# Patient Record
Sex: Male | Born: 1970 | Race: White | Hispanic: No | Marital: Single | Smoking: Current every day smoker
Health system: Southern US, Community
[De-identification: ages and names within clinical notes are randomized; demographics above are authoritative.]

---

## 2003-08-25 ENCOUNTER — Inpatient Hospital Stay (HOSPITAL_COMMUNITY): Admission: EM | Admit: 2003-08-25 | Discharge: 2003-08-27 | Payer: Self-pay | Admitting: Emergency Medicine

## 2017-03-29 ENCOUNTER — Emergency Department (HOSPITAL_COMMUNITY): Payer: Self-pay

## 2017-03-29 ENCOUNTER — Emergency Department (HOSPITAL_COMMUNITY)
Admission: EM | Admit: 2017-03-29 | Discharge: 2017-03-29 | Disposition: A | Discharge status: Home / Self Care | Payer: Self-pay | Attending: Emergency Medicine | Admitting: Emergency Medicine

## 2017-03-29 ENCOUNTER — Encounter (HOSPITAL_COMMUNITY): Payer: Self-pay | Admitting: Emergency Medicine

## 2017-03-29 DIAGNOSIS — Y92008 Other place in unspecified non-institutional (private) residence as the place of occurrence of the external cause: Secondary | ICD-10-CM | POA: Insufficient documentation

## 2017-03-29 DIAGNOSIS — Y999 Unspecified external cause status: Secondary | ICD-10-CM | POA: Insufficient documentation

## 2017-03-29 DIAGNOSIS — X509XXA Other and unspecified overexertion or strenuous movements or postures, initial encounter: Secondary | ICD-10-CM | POA: Insufficient documentation

## 2017-03-29 DIAGNOSIS — F17210 Nicotine dependence, cigarettes, uncomplicated: Secondary | ICD-10-CM | POA: Insufficient documentation

## 2017-03-29 DIAGNOSIS — S93402A Sprain of unspecified ligament of left ankle, initial encounter: Secondary | ICD-10-CM | POA: Insufficient documentation

## 2017-03-29 DIAGNOSIS — Y939 Activity, unspecified: Secondary | ICD-10-CM | POA: Insufficient documentation

## 2017-03-29 NOTE — ED Provider Notes (Signed)
[facility]-EMERGENCY DEPT Provider Note   CSN: [id number] Arrival date & time: 03/29/17  4098  By signing my name below, I, Patrick Jensen, attest that this documentation has been prepared under the direction and in the presence of Mathews Robinsons, PA-C. Electronically Signed: Deland Jensen, ED Scribe. 03/29/17. 10:28 AM.  History   Chief Complaint Chief Complaint  Patient presents with  . Foot Pain   The history is provided by the patient. No language interpreter was used.   HPI Comments: Patrick Jensen is an otherwise healthy 46 y.o. male who presents to the Emergency Department complaining of sudden onset of intermittent left ankle pain s/p an injury that occurred on 03/27/2017. The pt states that he slipped on his roof and inverted his ankle. He denies additional injuries including head injury. Putting weight on his ankle exacerbates his pain. The pt has applied a cool compress to his ankle with inadequate relief. No medications tried. He denies numbness, joint swelling, and fever.   History reviewed. No pertinent past medical history.  There are no active problems to display for this patient.   History reviewed. No pertinent surgical history.     Home Medications    Prior to Admission medications   Not on File    Family History No family history on file.  Social History Social History  Substance Use Topics  . Smoking status: Current Every Day Smoker    Packs/day: 2.00    Types: Cigarettes  . Smokeless tobacco: Not on file  . Alcohol use No     Allergies   Patient has no known allergies.   Review of Systems Review of Systems  Constitutional: Negative for fever.  Musculoskeletal: Positive for arthralgias. Negative for joint swelling.  Neurological: Negative for numbness.     Physical Exam Updated Vital Signs BP 140/87 (BP Location: Left Arm)   Pulse 67   Temp 97.7 F (36.5 C) (Oral)   Resp 18   Ht 5\' 10"  (1.778 m)   Wt 81.6 kg (180 lb)   SpO2  100%   BMI 25.83 kg/m   Physical Exam  Constitutional: He is oriented to person, place, and time. He appears well-developed and well-nourished. No distress.  Patient is afebrile, non-toxic appearing, seating comfortably in chair in no acute distress.   HENT:  Head: Normocephalic and atraumatic.  Eyes: EOM are normal.  Neck: Normal range of motion.  Cardiovascular: Normal rate, regular rhythm, normal heart sounds and intact distal pulses.  Exam reveals no gallop and no friction rub.   No murmur heard. Pulses:      Dorsalis pedis pulses are 2+ on the right side, and 2+ on the left side.   Intact dorsalis pedis  Pulmonary/Chest: Effort normal and breath sounds normal. No respiratory distress. He has no wheezes. He has no rales.  Abdominal: He exhibits no distension.  Musculoskeletal: Normal range of motion. He exhibits no edema, tenderness or deformity.  Full ROM of the left ankle. No edema noted of the left ankle joint. No tenderness to palpation of lateral medial malleolus, however slight tenderness to palpation if simultaneously palpated against medial malleolus. Negative anterior Drawer test.   Neurological: He is alert and oriented to person, place, and time. No sensory deficit. He exhibits normal muscle tone.  Neurovascularly intact.  Skin: Skin is warm and dry. Capillary refill takes less than 2 seconds. No rash noted. He is not diaphoretic. No erythema. No pallor.  Psychiatric: He has a normal mood and affect.  Nursing  note and vitals reviewed.    ED Treatments / Results   DIAGNOSTIC STUDIES: Oxygen Saturation is 100% on  RA, normal by my interpretation.   COORDINATION OF CARE: 10:24 AM-Discussed next steps with pt. Pt verbalized understanding and is agreeable with the plan.   Labs (all labs ordered are listed, but only abnormal results are displayed) Labs Reviewed - No data to display  EKG  EKG Interpretation None       Radiology Dg Foot Complete  Left  Result Date: 03/29/2017 CLINICAL DATA:  Left foot pain after injury 2 days ago. EXAM: LEFT FOOT - COMPLETE 3+ VIEW COMPARISON:  None. FINDINGS: There is no evidence of fracture or dislocation. There is no evidence of arthropathy or other focal bone abnormality. Soft tissues are unremarkable. IMPRESSION: Normal left foot. Electronically Signed   By: Lupita RaiderJames  Green Jr, M.D.   On: 03/29/2017 10:19    Procedures Procedures (including critical care time) SPLINT APPLICATION Date/Time: 11:03 AM Authorized by: Georgiana ShoreJessica B Rotha Cassels Consent: Verbal consent obtained. Risks and benefits: risks, benefits and alternatives were discussed Consent given by: patient Splint applied by: orthopedic technician Location details: left ankle Splint type: ankle brace Supplies used: ankle brace Post-procedure: The splinted body part was neurovascularly unchanged following the procedure. Patient tolerance: Patient tolerated the procedure well with no immediate complications.  Medications Ordered in ED Medications - No data to display   Initial Impression / Assessment and Plan / ED Course  I have reviewed the triage vital signs and the nursing notes.  Pertinent labs & imaging results that were available during my care of the patient were reviewed by me and considered in my medical decision making (see chart for details).    Patient presents with left ankle injury. Negative anterior drawer test, stable joint, no edema or ecchymosis, NVI distally. Reassuring exam.  Radiology without acute abnormality. RICE protocol indicated and discussed with patient. Ankle brace provided.  Discharge home with conservative management and follow up with PCP as needed.  Discussed strict return precautions and advised to return to the emergency department if experiencing any new or worsening symptoms. Instructions were understood and patient agreed with discharge plan.  Final Clinical Impressions(s) / ED Diagnoses   Final  diagnoses:  Sprain of left ankle, unspecified ligament, initial encounter    New Prescriptions New Prescriptions   No medications on file   I personally performed the services described in this documentation, which was scribed in my presence. The recorded information has been reviewed and is accurate.      Georgiana ShoreMitchell, Jamese Trauger B, PA-C 03/29/17 1104    Rolland PorterJames, Mark, MD 04/05/17 [phone]

## 2017-03-29 NOTE — ED Notes (Signed)
Bed: WTR6 Expected date:  Expected time:  Means of arrival:  Comments: 

## 2017-03-29 NOTE — Discharge Instructions (Signed)
As discussed, your xray did not show acute fracture or dislocation today.  Follow the RICE protocol provided in these instructions. Brace, ice, elevate, Ibuprofen for pain and swelling.  Follow up with your Primary care Provider if symptoms persist. Return if symptoms worsen or you experience new concerning symptoms in the meantime including numbness, swelling, redness, warmth.

## 2017-03-29 NOTE — ED Triage Notes (Signed)
Pt c/o left foot pain onset after slipping and inverting ankle, felt pop, on Monday. Pain over lateral ankle joint. 2+ pedal pulse. Motor function and sensation intact.

## 2017-05-02 ENCOUNTER — Emergency Department (HOSPITAL_COMMUNITY)
Admission: EM | Admit: 2017-05-02 | Discharge: 2017-05-02 | Disposition: A | Discharge status: Home / Self Care | Payer: Self-pay | Attending: Emergency Medicine | Admitting: Emergency Medicine

## 2017-05-02 ENCOUNTER — Encounter (HOSPITAL_COMMUNITY): Payer: Self-pay | Admitting: Emergency Medicine

## 2017-05-02 DIAGNOSIS — K029 Dental caries, unspecified: Secondary | ICD-10-CM | POA: Insufficient documentation

## 2017-05-02 DIAGNOSIS — F17210 Nicotine dependence, cigarettes, uncomplicated: Secondary | ICD-10-CM | POA: Insufficient documentation

## 2017-05-02 DIAGNOSIS — K0889 Other specified disorders of teeth and supporting structures: Secondary | ICD-10-CM

## 2017-05-02 MED ORDER — NAPROXEN 500 MG PO TABS: 500.0000 mg | ORAL_TABLET | Freq: Two times a day (BID) | ORAL | 0 refills | Status: DC

## 2017-05-02 MED ORDER — PENICILLIN V POTASSIUM 250 MG PO TABS: 500.0000 mg | ORAL_TABLET | Freq: Three times a day (TID) | ORAL | 0 refills | Status: AC

## 2017-05-02 MED ORDER — PENICILLIN V POTASSIUM 500 MG PO TABS: 500.0000 mg | ORAL_TABLET | Freq: Three times a day (TID) | ORAL | 0 refills | Status: DC

## 2017-05-02 NOTE — ED Triage Notes (Signed)
Pt states that on Saturday, he began to experience facial swelling on lt side and pain.  States that he believes he has an abscessed tooth.  No NVD.  No fevers.

## 2017-05-02 NOTE — Discharge Instructions (Signed)
We do not have a specific dentist on call today.  I have attached one of our resource guides that lists some dental clinics in addition to private dentists in the community.  Follow up with a dentist as soon as possible.

## 2017-05-02 NOTE — ED Notes (Signed)
Bed: WA03 Expected date:  Expected time:  Means of arrival:  Comments: 

## 2017-05-02 NOTE — ED Provider Notes (Signed)
[facility]-EMERGENCY DEPT Provider Note   CSN: [id number] Arrival date & time: 05/02/17  [id number]     History   Chief Complaint Chief Complaint  Patient presents with  . Dental Pain    HPI Patrick Jensen is a 46 y.o. male.  HPI Patient presents to the emergency room complaining of a toothache. Patient started developing swelling and pain on the left side of his face.  The symptoms were worse over the weekend but somewhat better today. Patient does not have a dentist. He came to the emergency room because of concerned about dental infection. He denies any fevers or chills. No difficulty swallowing. No past medical history on file.  There are no active problems to display for this patient.   No past surgical history on file.     Home Medications    Prior to Admission medications   Medication Sig Start Date End Date Taking? Authorizing Provider  naproxen (NAPROSYN) 500 MG tablet Take 1 tablet (500 mg total) by mouth 2 (two) times daily. 05/02/17   Linwood DibblesKnapp, Sartaj Hoskin, MD  penicillin v potassium (VEETID) 250 MG tablet Take 2 tablets (500 mg total) by mouth 3 (three) times daily. 05/02/17 05/09/17  Linwood DibblesKnapp, Oprah Camarena, MD    Family History No family history on file.  Social History Social History  Substance Use Topics  . Smoking status: Current Every Day Smoker    Packs/day: 2.00    Types: Cigarettes  . Smokeless tobacco: Never Used  . Alcohol use No     Allergies   Patient has no known allergies.   Review of Systems Review of Systems  All other systems reviewed and are negative.    Physical Exam Updated Vital Signs BP 129/84   Pulse 66   Temp 97.7 F (36.5 C)   Resp 18   Ht 1.778 m (5\' 10" )   Wt 83.9 kg (185 lb)   SpO2 100%   BMI 26.54 kg/m   Physical Exam  Constitutional: He appears well-developed and well-nourished. No distress.  HENT:  Head: Normocephalic and atraumatic.  Right Ear: External ear normal.  Left Ear: External ear normal.  Mouth/Throat: Dental caries  present.  no facial swelling or redness  Eyes: Conjunctivae are normal. Right eye exhibits no discharge. Left eye exhibits no discharge. No scleral icterus.  Neck: Neck supple. No tracheal deviation present.  Cardiovascular: Normal rate.   Pulmonary/Chest: Effort normal. No stridor. No respiratory distress.  Abdominal: He exhibits no distension.  Musculoskeletal: He exhibits no edema.  Neurological: He is alert. Cranial nerve deficit: no gross deficits.  Skin: Skin is warm and dry. No rash noted.  Psychiatric: He has a normal mood and affect.  Nursing note and vitals reviewed.    ED Treatments / Results    Radiology No results found.  Procedures Procedures (including critical care time)  Medications Ordered in ED Medications - No data to display   Initial Impression / Assessment and Plan / ED Course  I have reviewed the triage vital signs and the nursing notes.  Pertinent labs & imaging results that were available during my care of the patient were reviewed by me and considered in my medical decision making (see chart for details).    Symptoms are consistent with a tooth infection. There are no signs of a severe abscess. He has no facial swelling. At this time there does not appear to be any evidence of an emergency medical condition. I will refer him to a dentist/oral surgeon. Patient will be  given prescriptions  for pain medications and antibiotics.    Final Clinical Impressions(s) / ED Diagnoses   Final diagnoses:  Dental caries  Pain, dental    New Prescriptions New Prescriptions   NAPROXEN (NAPROSYN) 500 MG TABLET    Take 1 tablet (500 mg total) by mouth 2 (two) times daily.   PENICILLIN V POTASSIUM (VEETID) 250 MG TABLET    Take 2 tablets (500 mg total) by mouth 3 (three) times daily.     Linwood Dibbles, MD 05/02/17 1028

## 2018-02-27 ENCOUNTER — Encounter (HOSPITAL_COMMUNITY): Payer: Self-pay | Admitting: *Deleted

## 2018-02-27 ENCOUNTER — Emergency Department (HOSPITAL_COMMUNITY)
Admission: EM | Admit: 2018-02-27 | Discharge: 2018-02-27 | Disposition: A | Discharge status: Home / Self Care | Payer: Self-pay | Attending: Emergency Medicine | Admitting: Emergency Medicine

## 2018-02-27 DIAGNOSIS — F17210 Nicotine dependence, cigarettes, uncomplicated: Secondary | ICD-10-CM | POA: Insufficient documentation

## 2018-02-27 DIAGNOSIS — K029 Dental caries, unspecified: Secondary | ICD-10-CM | POA: Insufficient documentation

## 2018-02-27 DIAGNOSIS — K047 Periapical abscess without sinus: Secondary | ICD-10-CM | POA: Insufficient documentation

## 2018-02-27 MED ORDER — PENICILLIN V POTASSIUM 500 MG PO TABS
500.0000 mg | ORAL_TABLET | Freq: Once | ORAL | Status: AC
  Administered 2018-02-27: 500 mg via ORAL
  Filled 2018-02-27: qty 1

## 2018-02-27 MED ORDER — KETOROLAC TROMETHAMINE 60 MG/2ML IM SOLN
60.0000 mg | Freq: Once | INTRAMUSCULAR | Status: AC
  Administered 2018-02-27: 60 mg via INTRAMUSCULAR
  Filled 2018-02-27: qty 2

## 2018-02-27 MED ORDER — NAPROXEN 500 MG PO TABS: 500.0000 mg | ORAL_TABLET | Freq: Two times a day (BID) | ORAL | 0 refills | Status: DC

## 2018-02-27 MED ORDER — HYDROCODONE-ACETAMINOPHEN 5-325 MG PO TABS
2.0000 | ORAL_TABLET | Freq: Once | ORAL | Status: AC
  Administered 2018-02-27: 2 via ORAL
  Filled 2018-02-27: qty 2

## 2018-02-27 MED ORDER — PENICILLIN V POTASSIUM 500 MG PO TABS: 500.0000 mg | ORAL_TABLET | Freq: Three times a day (TID) | ORAL | 0 refills | Status: DC

## 2018-02-27 NOTE — ED Triage Notes (Signed)
Pt complains of right upper dental abscess for the past few days. Pt denies fever or chills.

## 2018-02-27 NOTE — ED Provider Notes (Signed)
[facility]-EMERGENCY DEPT Provider Note   CSN: [id number] Arrival date & time: 02/27/18  4098     History   Chief Complaint Chief Complaint  Patient presents with  . Dental Pain    HPI Patrick Jensen is a 47 y.o. male.   Dental Pain   This is a recurrent problem. The current episode started more than 2 days ago. The problem occurs constantly. The problem has not changed since onset.The pain is moderate.    History reviewed. No pertinent past medical history.  There are no active problems to display for this patient.   History reviewed. No pertinent surgical history.      Home Medications    Prior to Admission medications   Medication Sig Start Date End Date Taking? Authorizing Provider  naproxen (NAPROSYN) 500 MG tablet Take 1 tablet (500 mg total) by mouth 2 (two) times daily. 02/27/18   Madisynn Plair, Barbara Cower, MD  penicillin v potassium (VEETID) 500 MG tablet Take 1 tablet (500 mg total) by mouth 3 (three) times daily. 02/27/18   Grecia Lynk, Barbara Cower, MD    Family History No family history on file.  Social History Social History   Tobacco Use  . Smoking status: Current Every Day Smoker    Packs/day: 2.00    Types: Cigarettes  . Smokeless tobacco: Never Used  Substance Use Topics  . Alcohol use: No  . Drug use: Yes    Types: Marijuana     Allergies   Patient has no known allergies.   Review of Systems Review of Systems  All other systems reviewed and are negative.    Physical Exam Updated Vital Signs BP (!) 137/91 (BP Location: Left Arm)   Pulse 76   Temp 98 F (36.7 C) (Oral)   Resp 18   SpO2 100%   Physical Exam  Constitutional: He is oriented to person, place, and time. He appears well-developed and well-nourished.  HENT:  Head: Normocephalic and atraumatic.  Mouth/Throat:    Eyes: Conjunctivae and EOM are normal.  Neck: Normal range of motion.  Cardiovascular: Normal rate.  Pulmonary/Chest: Effort normal. No respiratory  distress.  Abdominal: Soft. He exhibits no distension.  Musculoskeletal: Normal range of motion.  Neurological: He is alert and oriented to person, place, and time. No cranial nerve deficit. Coordination normal.  Skin: Skin is warm.  Nursing note and vitals reviewed.    ED Treatments / Results  Labs (all labs ordered are listed, but only abnormal results are displayed) Labs Reviewed - No data to display  EKG None  Radiology No results found.  Procedures Procedures (including critical care time)  Medications Ordered in ED Medications  penicillin v potassium (VEETID) tablet 500 mg (500 mg Oral Given 02/27/18 0934)  HYDROcodone-acetaminophen (NORCO/VICODIN) 5-325 MG per tablet 2 tablet (2 tablets Oral Given 02/27/18 0934)  ketorolac (TORADOL) injection 60 mg (60 mg Intramuscular Given 02/27/18 0935)     Initial Impression / Assessment and Plan / ED Course  I have reviewed the triage vital signs and the nursing notes.  Pertinent labs & imaging results that were available during my care of the patient were reviewed by me and considered in my medical decision making (see chart for details).     Dental infection. No e/o abscess or other complication. Abx/nsaids/dental follow up.  Final Clinical Impressions(s) / ED Diagnoses   Final diagnoses:  Dental caries  Dental infection    ED Discharge Orders        Ordered  penicillin v potassium (VEETID) 500 MG tablet  3 times daily     02/27/18 0936    naproxen (NAPROSYN) 500 MG tablet  2 times daily     02/27/18 0936       Lakin Rhine, Barbara CowerJason, MD 02/27/18 [id number]

## 2019-03-15 ENCOUNTER — Emergency Department (HOSPITAL_COMMUNITY)
Admission: EM | Admit: 2019-03-15 | Discharge: 2019-03-15 | Disposition: A | Discharge status: Home / Self Care | Payer: Self-pay | Attending: Emergency Medicine | Admitting: Emergency Medicine

## 2019-03-15 ENCOUNTER — Other Ambulatory Visit: Payer: Self-pay

## 2019-03-15 ENCOUNTER — Emergency Department (HOSPITAL_COMMUNITY): Payer: Self-pay

## 2019-03-15 ENCOUNTER — Encounter (HOSPITAL_COMMUNITY): Payer: Self-pay | Admitting: Emergency Medicine

## 2019-03-15 DIAGNOSIS — Y92008 Other place in unspecified non-institutional (private) residence as the place of occurrence of the external cause: Secondary | ICD-10-CM | POA: Insufficient documentation

## 2019-03-15 DIAGNOSIS — F17210 Nicotine dependence, cigarettes, uncomplicated: Secondary | ICD-10-CM | POA: Insufficient documentation

## 2019-03-15 DIAGNOSIS — Z791 Long term (current) use of non-steroidal anti-inflammatories (NSAID): Secondary | ICD-10-CM | POA: Insufficient documentation

## 2019-03-15 DIAGNOSIS — W1781XA Fall down embankment (hill), initial encounter: Secondary | ICD-10-CM | POA: Insufficient documentation

## 2019-03-15 DIAGNOSIS — Y999 Unspecified external cause status: Secondary | ICD-10-CM | POA: Insufficient documentation

## 2019-03-15 DIAGNOSIS — S52122A Displaced fracture of head of left radius, initial encounter for closed fracture: Secondary | ICD-10-CM

## 2019-03-15 DIAGNOSIS — Y9389 Activity, other specified: Secondary | ICD-10-CM | POA: Insufficient documentation

## 2019-03-15 LAB — CBC WITH DIFFERENTIAL/PLATELET
Abs Immature Granulocytes: 0.05 10*3/uL (ref 0.00–0.07)
Basophils Absolute: 0.1 10*3/uL (ref 0.0–0.1)
Basophils Relative: 1 %
Eosinophils Absolute: 0.2 10*3/uL (ref 0.0–0.5)
Eosinophils Relative: 1 %
HCT: 42.5 % (ref 39.0–52.0)
Hemoglobin: 14.2 g/dL (ref 13.0–17.0)
Immature Granulocytes: 0 %
Lymphocytes Relative: 15 %
Lymphs Abs: 2 10*3/uL (ref 0.7–4.0)
MCH: 29.8 pg (ref 26.0–34.0)
MCHC: 33.4 g/dL (ref 30.0–36.0)
MCV: 89.1 fL (ref 80.0–100.0)
Monocytes Absolute: 0.7 10*3/uL (ref 0.1–1.0)
Monocytes Relative: 5 %
Neutro Abs: 10.6 10*3/uL — ABNORMAL HIGH (ref 1.7–7.7)
Neutrophils Relative %: 78 %
Platelets: 308 10*3/uL (ref 150–400)
RBC: 4.77 MIL/uL (ref 4.22–5.81)
RDW: 13.6 % (ref 11.5–15.5)
WBC: 13.5 10*3/uL — ABNORMAL HIGH (ref 4.0–10.5)
nRBC: 0 % (ref 0.0–0.2)

## 2019-03-15 LAB — BASIC METABOLIC PANEL
Anion gap: 8 (ref 5–15)
BUN: 9 mg/dL (ref 6–20)
CO2: 25 mmol/L (ref 22–32)
Calcium: 9.1 mg/dL (ref 8.9–10.3)
Chloride: 104 mmol/L (ref 98–111)
Creatinine, Ser: 0.86 mg/dL (ref 0.61–1.24)
GFR calc Af Amer: >60 mL/min (ref >60)
GFR calc non Af Amer: >60 mL/min (ref >60)
Glucose, Bld: 100 mg/dL — ABNORMAL HIGH (ref 70–99)
Potassium: 4 mmol/L (ref 3.5–5.1)
Sodium: 137 mmol/L (ref 135–145)

## 2019-03-15 MED ORDER — MORPHINE SULFATE (PF) 4 MG/ML IV SOLN
4.0000 mg | Freq: Once | INTRAVENOUS | Status: AC
  Administered 2019-03-15: 12:00:00 4 mg via INTRAMUSCULAR
  Filled 2019-03-15: qty 1

## 2019-03-15 MED ORDER — OXYCODONE-ACETAMINOPHEN 5-325 MG PO TABS: 1.0000 | ORAL_TABLET | Freq: Four times a day (QID) | ORAL | 0 refills | Status: DC | PRN

## 2019-03-15 MED ORDER — IBUPROFEN 600 MG PO TABS: 600.0000 mg | ORAL_TABLET | Freq: Four times a day (QID) | ORAL | 0 refills | Status: DC | PRN

## 2019-03-15 NOTE — Progress Notes (Signed)
Orthopedic Tech Progress Note Patient Details:  Patrick Jensen [birth year] [id number]  Ortho Devices Type of Ortho Device: Shoulder immobilizer, Post (long) splint Splint Material: Fiberglass Ortho Device/Splint Location: ULE Ortho Device/Splint Interventions: Adjustment, Application, Ordered   Post Interventions Patient Tolerated: Well Instructions Provided: Care of device, Adjustment of device   Janit Pagan 03/15/2019, 1:49 PM

## 2019-03-15 NOTE — ED Notes (Signed)
Patient verbalizes understanding of discharge instructions. Opportunity for questioning and answers were provided. Armband removed by staff, pt discharged from ED home via POV.  

## 2019-03-15 NOTE — ED Triage Notes (Signed)
Pt reports he fell off a porch and believes he might have broken his elbow.

## 2019-03-15 NOTE — Consult Note (Signed)
Reason for Consult:Left elbow fx Referring Physician: Sharmon Jensen  Patrick Jensen is an 48 y.o. male.  HPI: Patrick Jensen was working on a porch and fell about 8 feet. He landed on his feet then fell backwards and used his elbows to break his fall. He had immediate left elbow pain and came to the ED for evaluation. X-rays showed a radial head fx and orthopedic surgery was consulted. He c/o localized pain to the area only.  History reviewed. No pertinent past medical history.  History reviewed. No pertinent surgical history.  No family history on file.  Social History:  reports that he has been smoking cigarettes. He has been smoking about 2.00 packs per day. He has never used smokeless tobacco. He reports current drug use. Drug: Marijuana. He reports that he does not drink alcohol.  Allergies: No Known Allergies  Medications: I have reviewed the patient's current medications.  No results found for this or any previous visit (from the past 48 hour(s)).  Dg Elbow Complete Left  Result Date: 03/15/2019 CLINICAL DATA:  Fall today.  Elbow pain with limited extension. EXAM: LEFT ELBOW - COMPLETE 3+ VIEW COMPARISON:  None. FINDINGS: There is an acute posteriorly displaced fracture of the radial head. The radial head no longer articulates with the capitellum. The distal humerus and proximal ulna appear intact. There is a moderate-sized elbow joint effusion. IMPRESSION: Posteriorly displaced fracture of the radial head, no longer articulating with the capitellum. Electronically Signed   By: Richardean Sale M.D.   On: 03/15/2019 11:49    Review of Systems  Constitutional: Negative for weight loss.  HENT: Negative for ear discharge, ear pain, hearing loss and tinnitus.   Eyes: Negative for blurred vision, double vision, photophobia and pain.  Respiratory: Negative for cough, sputum production and shortness of breath.   Cardiovascular: Negative for chest pain.  Gastrointestinal: Negative for abdominal pain,  nausea and vomiting.  Genitourinary: Negative for dysuria, flank pain, frequency and urgency.  Musculoskeletal: Positive for joint pain (Left elbow). Negative for back pain, falls, myalgias and neck pain.  Neurological: Negative for dizziness, tingling, sensory change, focal weakness, loss of consciousness and headaches.  Endo/Heme/Allergies: Does not bruise/bleed easily.  Psychiatric/Behavioral: Negative for depression, memory loss and substance abuse. The patient is not nervous/anxious.    Blood pressure (!) 137/96, pulse 74, temperature 98.6 F (37 C), temperature source Oral, height 5\' 10"  (1.778 m), weight 81.6 kg, SpO2 98 %. Physical Exam  Constitutional: He appears well-developed and well-nourished. No distress.  HENT:  Head: Normocephalic and atraumatic.  Eyes: Conjunctivae are normal. Right eye exhibits no discharge. Left eye exhibits no discharge. No scleral icterus.  Neck: Normal range of motion.  Cardiovascular: Normal rate and regular rhythm.  Respiratory: Effort normal. No respiratory distress.  Musculoskeletal:     Comments: Left shoulder, elbow, wrist, digits- no skin wounds, mod TTP, edematous, no instability, unable to fully extend  Sens  Ax/R/M/U intact  Mot   Ax/ R/ PIN/ M/ AIN/ U intact  Rad 2+  Neurological: He is alert.  Skin: Skin is warm and dry. He is not diaphoretic.  Psychiatric: He has a normal mood and affect. His behavior is normal.    Assessment/Plan: Left radial head fx -- Plan OP repair by Dr. Griffin Basil. His office will call to set up early next week. Splint, sling, NWB until then.    Lisette Abu, PA-C Orthopedic Surgery [phone] 03/15/2019, 12:23 PM

## 2019-03-15 NOTE — ED Provider Notes (Signed)
[name] Provider Note   CSN: [id number] Arrival date & time: 03/15/19  1046    History   Chief Complaint Chief Complaint  Patient presents with  . Elbow Pain    HPI Patrick Jensen is a 48 y.o. male who is previously healthy who presents with left elbow pain after falling on it.  Patient reports he was working on fixing a porch and it broke and fell away from the house when he fell backwards onto his left elbow.  He did not hit his head or lose consciousness.  He is right-handed.  He denies any numbness or tingling.  He does have some range of motion to the area, but swelling and pain to his left elbow.  Patient denies any other injuries.     HPI  History reviewed. No pertinent past medical history.  There are no active problems to display for this patient.   History reviewed. No pertinent surgical history.      Home Medications    Prior to Admission medications   Medication Sig Start Date End Date Taking? Authorizing Provider  ibuprofen (ADVIL) 600 MG tablet Take 1 tablet (600 mg total) by mouth every 6 (six) hours as needed. 03/15/19   Janett Kamath, Bea Graff, PA-C  naproxen (NAPROSYN) 500 MG tablet Take 1 tablet (500 mg total) by mouth 2 (two) times daily. 02/27/18   Mesner, Corene Cornea, MD  oxyCODONE-acetaminophen (PERCOCET/ROXICET) 5-325 MG tablet Take 1 tablet by mouth every 6 (six) hours as needed for severe pain. 03/15/19   Frederica Kuster, PA-C  penicillin v potassium (VEETID) 500 MG tablet Take 1 tablet (500 mg total) by mouth 3 (three) times daily. 02/27/18   Mesner, Corene Cornea, MD    Family History No family history on file.  Social History Social History   Tobacco Use  . Smoking status: Current Every Day Smoker    Packs/day: 2.00    Types: Cigarettes  . Smokeless tobacco: Never Used  Substance Use Topics  . Alcohol use: No  . Drug use: Yes    Types: Marijuana     Allergies   Patient has no known allergies.   Review of  Systems Review of Systems  Musculoskeletal: Positive for arthralgias and joint swelling.  Neurological: Negative for numbness.     Physical Exam Updated Vital Signs BP (!) 157/108 (BP Location: Right Arm)   Pulse 70   Temp 98.6 F (37 C) (Oral)   Resp 18   Ht 5\' 10"  (1.778 m)   Wt 81.6 kg   SpO2 98%   BMI 25.83 kg/m   Physical Exam Vitals signs and nursing note reviewed.  Constitutional:      General: He is not in acute distress.    Appearance: He is well-developed. He is not diaphoretic.  HENT:     Head: Normocephalic and atraumatic.     Mouth/Throat:     Pharynx: No oropharyngeal exudate.  Eyes:     General: No scleral icterus.       Right eye: No discharge.        Left eye: No discharge.     Conjunctiva/sclera: Conjunctivae normal.     Pupils: Pupils are equal, round, and reactive to light.  Neck:     Musculoskeletal: Normal range of motion and neck supple.     Thyroid: No thyromegaly.  Cardiovascular:     Rate and Rhythm: Normal rate and regular rhythm.     Heart sounds: Normal heart sounds. No  murmur. No friction rub. No gallop.   Pulmonary:     Effort: Pulmonary effort is normal. No respiratory distress.     Breath sounds: Normal breath sounds. No stridor. No wheezing or rales.  Abdominal:     General: Bowel sounds are normal. There is no distension.     Palpations: Abdomen is soft.     Tenderness: There is no abdominal tenderness. There is no guarding or rebound.  Musculoskeletal:     Comments: Swelling and tenderness noted to the left elbow with limited range of motion, however range of motion is intact; radial pulse intact, sensation intact, cap refill less than 2 seconds No bony tenderness elsewhere  Lymphadenopathy:     Cervical: No cervical adenopathy.  Skin:    General: Skin is warm and dry.     Coloration: Skin is not pale.     Findings: No rash.  Neurological:     Mental Status: He is alert.     Coordination: Coordination normal.      ED  Treatments / Results  Labs (all labs ordered are listed, but only abnormal results are displayed) Labs Reviewed  BASIC METABOLIC PANEL - Abnormal; Notable for the following components:      Result Value   Glucose, Bld 100 (*)    All other components within normal limits  CBC WITH DIFFERENTIAL/PLATELET - Abnormal; Notable for the following components:   WBC 13.5 (*)    Neutro Abs 10.6 (*)    All other components within normal limits    EKG None  Radiology Dg Elbow Complete Left  Result Date: 03/15/2019 CLINICAL DATA:  Fall today.  Elbow pain with limited extension. EXAM: LEFT ELBOW - COMPLETE 3+ VIEW COMPARISON:  None. FINDINGS: There is an acute posteriorly displaced fracture of the radial head. The radial head no longer articulates with the capitellum. The distal humerus and proximal ulna appear intact. There is a moderate-sized elbow joint effusion. IMPRESSION: Posteriorly displaced fracture of the radial head, no longer articulating with the capitellum. Electronically Signed   By: Carey BullocksWilliam  Veazey M.D.   On: 03/15/2019 11:49    Procedures Procedures (including critical care time)   Medications Ordered in ED Medications  morphine 4 MG/ML injection 4 mg (4 mg Intramuscular Given 03/15/19 1204)     Initial Impression / Assessment and Plan / ED Course  I have reviewed the triage vital signs and the nursing notes.  Pertinent labs & imaging results that were available during my care of the patient were reviewed by me and considered in my medical decision making (see chart for details).        Patient found to have posteriorly displaced fracture of the radial head that is no longer articulating with the capitellum.  Patient evaluated by Earney HamburgMichael Jeffrey, PA-C with orthopedics who discussed with orthopedic surgeon, Dr. Everardo PacificVarkey, who advised elbow splint, sling, and follow-up for surgery next week.  Patient will be discharged home with pain medication and follow-up as planned.  Return  precautions discussed.  Patient understands and agrees with plan.  Patient vitals stable throughout ED course and discharged in satisfactory condition.  Final Clinical Impressions(s) / ED Diagnoses   Final diagnoses:  Closed displaced fracture of head of left radius, initial encounter    ED Discharge Orders         Ordered    oxyCODONE-acetaminophen (PERCOCET/ROXICET) 5-325 MG tablet  Every 6 hours PRN     03/15/19 1359    ibuprofen (ADVIL) 600 MG tablet  Every 6 hours PRN     03/15/19 1359           Emi HolesLaw, Tyrin Herbers M, PA-C 03/15/19 1620    Bethann BerkshireZammit, Joseph, MD 03/19/19 1250

## 2019-03-15 NOTE — Discharge Instructions (Addendum)
Take Percocet every 6 hours as needed for severe pain.  For mild to moderate pain, take ibuprofen.  Keep splint in place at all times and keep dry.  Wear sling whenever you are not bathing.  Please follow-up with Dr. Griffin Basil as planned early next week.  Please return to the emergency department you develop any new or worsening symptoms including complete numbness or color changing of your fingers or if your splint gets wet.  Do not drink alcohol, drive, operate machinery or participate in any other potentially dangerous activities while taking opiate pain medication as it may make you sleepy. Do not take this medication with any other sedating medications, either prescription or over-the-counter. If you were prescribed Percocet or Vicodin, do not take these with acetaminophen (Tylenol) as it is already contained within these medications and overdose of Tylenol is dangerous.   This medication is an opiate (or narcotic) pain medication and can be habit forming.  Use it as little as possible to achieve adequate pain control.  Do not use or use it with extreme caution if you have a history of opiate abuse or dependence. This medication is intended for your use only - do not give any to anyone else and keep it in a secure place where nobody else, especially children, have access to it. It will also cause or worsen constipation, so you may want to consider taking an over-the-counter stool softener while you are taking this medication.

## 2019-03-18 ENCOUNTER — Other Ambulatory Visit: Payer: Self-pay

## 2019-03-18 ENCOUNTER — Encounter (HOSPITAL_BASED_OUTPATIENT_CLINIC_OR_DEPARTMENT_OTHER): Payer: Self-pay | Admitting: *Deleted

## 2019-03-18 ENCOUNTER — Other Ambulatory Visit (HOSPITAL_COMMUNITY)
Admission: RE | Admit: 2019-03-18 | Discharge: 2019-03-18 | Disposition: A | Discharge status: Home / Self Care | Payer: HRSA Program | Source: Ambulatory Visit | Attending: Orthopaedic Surgery | Admitting: Orthopaedic Surgery

## 2019-03-18 DIAGNOSIS — Z01812 Encounter for preprocedural laboratory examination: Secondary | ICD-10-CM | POA: Insufficient documentation

## 2019-03-18 DIAGNOSIS — Z1159 Encounter for screening for other viral diseases: Secondary | ICD-10-CM | POA: Insufficient documentation

## 2019-03-18 LAB — SARS CORONAVIRUS 2 (TAT 6-24 HRS): SARS Coronavirus 2: NEGATIVE

## 2019-03-19 NOTE — Progress Notes (Signed)
Ensure pre surgery drink given with instructions to complete by 0800 dos, pt verbalized understanding. 

## 2019-03-21 ENCOUNTER — Other Ambulatory Visit: Payer: Self-pay

## 2019-03-21 ENCOUNTER — Ambulatory Visit (HOSPITAL_BASED_OUTPATIENT_CLINIC_OR_DEPARTMENT_OTHER): Payer: Worker's Compensation | Admitting: Anesthesiology

## 2019-03-21 ENCOUNTER — Encounter (HOSPITAL_BASED_OUTPATIENT_CLINIC_OR_DEPARTMENT_OTHER)
Admission: RE | Disposition: A | Discharge status: Home / Self Care | Payer: Self-pay | Source: Home / Self Care | Attending: Orthopaedic Surgery

## 2019-03-21 ENCOUNTER — Ambulatory Visit (HOSPITAL_BASED_OUTPATIENT_CLINIC_OR_DEPARTMENT_OTHER)
Admission: RE | Admit: 2019-03-21 | Discharge: 2019-03-21 | Disposition: A | Discharge status: Home / Self Care | Payer: Worker's Compensation | Attending: Orthopaedic Surgery | Admitting: Orthopaedic Surgery

## 2019-03-21 ENCOUNTER — Encounter (HOSPITAL_BASED_OUTPATIENT_CLINIC_OR_DEPARTMENT_OTHER): Payer: Self-pay | Admitting: Anesthesiology

## 2019-03-21 DIAGNOSIS — Z791 Long term (current) use of non-steroidal anti-inflammatories (NSAID): Secondary | ICD-10-CM | POA: Diagnosis not present

## 2019-03-21 DIAGNOSIS — S52122A Displaced fracture of head of left radius, initial encounter for closed fracture: Secondary | ICD-10-CM | POA: Diagnosis not present

## 2019-03-21 DIAGNOSIS — F17210 Nicotine dependence, cigarettes, uncomplicated: Secondary | ICD-10-CM | POA: Insufficient documentation

## 2019-03-21 DIAGNOSIS — S52132A Displaced fracture of neck of left radius, initial encounter for closed fracture: Secondary | ICD-10-CM | POA: Insufficient documentation

## 2019-03-21 DIAGNOSIS — W11XXXA Fall on and from ladder, initial encounter: Secondary | ICD-10-CM | POA: Diagnosis not present

## 2019-03-21 DIAGNOSIS — S53432A Radial collateral ligament sprain of left elbow, initial encounter: Secondary | ICD-10-CM | POA: Insufficient documentation

## 2019-03-21 HISTORY — PX: RADIAL HEAD ARTHROPLASTY: SHX6044

## 2019-03-21 SURGERY — ARTHROPLASTY, RADIUS, HEAD
Anesthesia: General | Site: Elbow | Laterality: Left

## 2019-03-21 MED ORDER — MIDAZOLAM HCL 2 MG/2ML IJ SOLN
INTRAMUSCULAR | Status: AC
  Filled 2019-03-21: qty 2

## 2019-03-21 MED ORDER — LIDOCAINE 2% (20 MG/ML) 5 ML SYRINGE
INTRAMUSCULAR | Status: DC | PRN
  Administered 2019-03-21: 60 mg via INTRAVENOUS

## 2019-03-21 MED ORDER — DEXAMETHASONE SODIUM PHOSPHATE 4 MG/ML IJ SOLN
INTRAMUSCULAR | Status: DC | PRN
  Administered 2019-03-21: 10 mg via INTRAVENOUS

## 2019-03-21 MED ORDER — CEFAZOLIN SODIUM-DEXTROSE 2-4 GM/100ML-% IV SOLN
INTRAVENOUS | Status: AC
  Filled 2019-03-21: qty 100

## 2019-03-21 MED ORDER — MEPERIDINE HCL 25 MG/ML IJ SOLN: 6.2500 mg | INTRAMUSCULAR | Status: DC | PRN

## 2019-03-21 MED ORDER — METOCLOPRAMIDE HCL 5 MG/ML IJ SOLN
INTRAMUSCULAR | Status: AC
  Filled 2019-03-21: qty 2

## 2019-03-21 MED ORDER — SCOPOLAMINE 1 MG/3DAYS TD PT72: 1.0000 | MEDICATED_PATCH | Freq: Once | TRANSDERMAL | Status: DC

## 2019-03-21 MED ORDER — MIDAZOLAM HCL 2 MG/2ML IJ SOLN
1.0000 mg | INTRAMUSCULAR | Status: DC | PRN
  Administered 2019-03-21: 2 mg via INTRAVENOUS

## 2019-03-21 MED ORDER — ONDANSETRON HCL 4 MG PO TABS: 4.0000 mg | ORAL_TABLET | Freq: Three times a day (TID) | ORAL | 1 refills | Status: AC | PRN

## 2019-03-21 MED ORDER — LACTATED RINGERS IV SOLN
INTRAVENOUS | Status: DC
  Administered 2019-03-21: 11:00:00 via INTRAVENOUS

## 2019-03-21 MED ORDER — LACTATED RINGERS IV SOLN: INTRAVENOUS | Status: DC

## 2019-03-21 MED ORDER — PROPOFOL 10 MG/ML IV BOLUS
INTRAVENOUS | Status: DC | PRN
  Administered 2019-03-21: 200 mg via INTRAVENOUS

## 2019-03-21 MED ORDER — VANCOMYCIN HCL 500 MG IV SOLR
INTRAVENOUS | Status: AC
  Filled 2019-03-21: qty 1000

## 2019-03-21 MED ORDER — DEXAMETHASONE SODIUM PHOSPHATE 10 MG/ML IJ SOLN
INTRAMUSCULAR | Status: AC
  Filled 2019-03-21: qty 1

## 2019-03-21 MED ORDER — FENTANYL CITRATE (PF) 100 MCG/2ML IJ SOLN: 25.0000 ug | INTRAMUSCULAR | Status: DC | PRN

## 2019-03-21 MED ORDER — ONDANSETRON HCL 4 MG/2ML IJ SOLN
INTRAMUSCULAR | Status: AC
  Filled 2019-03-21: qty 2

## 2019-03-21 MED ORDER — FENTANYL CITRATE (PF) 100 MCG/2ML IJ SOLN
50.0000 ug | INTRAMUSCULAR | Status: DC | PRN
  Administered 2019-03-21: 100 ug via INTRAVENOUS

## 2019-03-21 MED ORDER — METOCLOPRAMIDE HCL 5 MG/ML IJ SOLN
10.0000 mg | Freq: Once | INTRAMUSCULAR | Status: AC | PRN
  Administered 2019-03-21: 10 mg via INTRAVENOUS

## 2019-03-21 MED ORDER — OXYCODONE HCL 5 MG PO TABS: ORAL_TABLET | ORAL | 0 refills | Status: AC

## 2019-03-21 MED ORDER — ONDANSETRON HCL 4 MG/2ML IJ SOLN
INTRAMUSCULAR | Status: DC | PRN
  Administered 2019-03-21: 4 mg via INTRAVENOUS

## 2019-03-21 MED ORDER — MELOXICAM 7.5 MG PO TABS: 7.5000 mg | ORAL_TABLET | Freq: Every day | ORAL | 2 refills | Status: AC

## 2019-03-21 MED ORDER — ROPIVACAINE HCL 5 MG/ML IJ SOLN
INTRAMUSCULAR | Status: DC | PRN
  Administered 2019-03-21: 30 mL via PERINEURAL

## 2019-03-21 MED ORDER — CHLORHEXIDINE GLUCONATE 4 % EX LIQD: 60.0000 mL | Freq: Once | CUTANEOUS | Status: DC

## 2019-03-21 MED ORDER — VANCOMYCIN HCL 1000 MG IV SOLR
INTRAVENOUS | Status: DC | PRN
  Administered 2019-03-21: 1000 mg

## 2019-03-21 MED ORDER — ACETAMINOPHEN 500 MG PO TABS: 1000.0000 mg | ORAL_TABLET | Freq: Three times a day (TID) | ORAL | 0 refills | Status: AC

## 2019-03-21 MED ORDER — FENTANYL CITRATE (PF) 100 MCG/2ML IJ SOLN
INTRAMUSCULAR | Status: AC
  Filled 2019-03-21: qty 2

## 2019-03-21 MED ORDER — LIDOCAINE 2% (20 MG/ML) 5 ML SYRINGE
INTRAMUSCULAR | Status: AC
  Filled 2019-03-21: qty 5

## 2019-03-21 MED ORDER — PROPOFOL 10 MG/ML IV BOLUS
INTRAVENOUS | Status: AC
  Filled 2019-03-21: qty 20

## 2019-03-21 MED ORDER — CEFAZOLIN SODIUM-DEXTROSE 2-4 GM/100ML-% IV SOLN
2.0000 g | INTRAVENOUS | Status: AC
  Administered 2019-03-21: 2 g via INTRAVENOUS

## 2019-03-21 SURGICAL SUPPLY — 72 items
ANCHOR JUGGERKNOT W/DRL 2/1.45 (Orthopedic Implant) ×2 IMPLANT
BANDAGE ACE 4X5 VEL STRL LF (GAUZE/BANDAGES/DRESSINGS) ×3 IMPLANT
BENZOIN TINCTURE PRP APPL 2/3 (GAUZE/BANDAGES/DRESSINGS) ×3 IMPLANT
BLADE AVERAGE 25MMX9MM (BLADE) ×1
BLADE AVERAGE 25X9 (BLADE) ×2 IMPLANT
BLADE HEX COATED 2.75 (ELECTRODE) ×3 IMPLANT
BLADE SURG 10 STRL SS (BLADE) ×3 IMPLANT
BLADE SURG 15 STRL LF DISP TIS (BLADE) ×1 IMPLANT
BLADE SURG 15 STRL SS (BLADE) ×2
BNDG ESMARK 4X9 LF (GAUZE/BANDAGES/DRESSINGS) ×3 IMPLANT
CHLORAPREP W/TINT 26 (MISCELLANEOUS) ×5 IMPLANT
CLOSURE WOUND 1/2 X4 (GAUZE/BANDAGES/DRESSINGS) ×1
COVER WAND RF STERILE (DRAPES) IMPLANT
CUFF TOURN SGL QUICK 18X3 (MISCELLANEOUS) ×3 IMPLANT
CUFF TOURN SGL QUICK 24 (TOURNIQUET CUFF) ×2
CUFF TRNQT CYL 24X4X16.5-23 (TOURNIQUET CUFF) IMPLANT
DECANTER SPIKE VIAL GLASS SM (MISCELLANEOUS) IMPLANT
DRAPE EXTREMITY T 121X128X90 (DISPOSABLE) ×3 IMPLANT
DRAPE INCISE IOBAN 66X45 STRL (DRAPES) IMPLANT
DRAPE OEC MINIVIEW 54X84 (DRAPES) ×2 IMPLANT
DRAPE U-SHAPE 47X51 STRL (DRAPES) ×3 IMPLANT
ELECT REM PT RETURN 9FT ADLT (ELECTROSURGICAL) ×3
ELECTRODE REM PT RTRN 9FT ADLT (ELECTROSURGICAL) ×1 IMPLANT
GAUZE SPONGE 4X4 12PLY STRL (GAUZE/BANDAGES/DRESSINGS) ×3 IMPLANT
GLOVE BIOGEL M 7.0 STRL (GLOVE) ×2 IMPLANT
GLOVE BIOGEL M STRL SZ7.5 (GLOVE) ×2 IMPLANT
GLOVE BIOGEL PI IND STRL 7.5 (GLOVE) IMPLANT
GLOVE BIOGEL PI IND STRL 8 (GLOVE) ×1 IMPLANT
GLOVE BIOGEL PI INDICATOR 7.5 (GLOVE) ×4
GLOVE BIOGEL PI INDICATOR 8 (GLOVE) ×4
GLOVE ECLIPSE 8.0 STRL XLNG CF (GLOVE) ×5 IMPLANT
GOWN STRL REUS W/ TWL LRG LVL3 (GOWN DISPOSABLE) ×1 IMPLANT
GOWN STRL REUS W/TWL LRG LVL3 (GOWN DISPOSABLE)
GOWN STRL REUS W/TWL XL LVL3 (GOWN DISPOSABLE) ×5 IMPLANT
IMPL RADIAL HEAD 26 (Orthopedic Implant) IMPLANT
IMPL STEM 8X2 (Orthopedic Implant) IMPLANT
IMPLANT RADIAL HEAD 26 (Orthopedic Implant) ×3 IMPLANT
IMPLANT STEM 8X2 (Orthopedic Implant) ×3 IMPLANT
NS IRRIG 1000ML POUR BTL (IV SOLUTION) ×3 IMPLANT
PACK ARTHROSCOPY DSU (CUSTOM PROCEDURE TRAY) ×3 IMPLANT
PACK BASIN DAY SURGERY FS (CUSTOM PROCEDURE TRAY) ×3 IMPLANT
PAD CAST 4YDX4 CTTN HI CHSV (CAST SUPPLIES) ×1 IMPLANT
PADDING CAST COTTON 4X4 STRL (CAST SUPPLIES) ×2
PENCIL BUTTON HOLSTER BLD 10FT (ELECTRODE) ×3 IMPLANT
SHEET MEDIUM DRAPE 40X70 STRL (DRAPES) ×3 IMPLANT
SLEEVE SCD COMPRESS KNEE MED (MISCELLANEOUS) ×2 IMPLANT
SLING ARM FOAM STRAP LRG (SOFTGOODS) ×3 IMPLANT
SLING ARM FOAM STRAP XLG (SOFTGOODS) ×2 IMPLANT
SLING ARM IMMOBILIZER LRG (SOFTGOODS) IMPLANT
SLING ARM IMMOBILIZER MED (SOFTGOODS) IMPLANT
SLING ARM MED ADULT FOAM STRAP (SOFTGOODS) IMPLANT
SLING ARM XL FOAM STRAP (SOFTGOODS) IMPLANT
SPLINT FAST PLASTER 5X30 (CAST SUPPLIES) ×20
SPLINT PLASTER CAST FAST 5X30 (CAST SUPPLIES) ×10 IMPLANT
SPONGE LAP 18X18 RF (DISPOSABLE) ×3 IMPLANT
STAPLER VISISTAT 35W (STAPLE) IMPLANT
STRIP CLOSURE SKIN 1/2X4 (GAUZE/BANDAGES/DRESSINGS) ×2 IMPLANT
SUCTION FRAZIER HANDLE 10FR (MISCELLANEOUS) ×2
SUCTION TUBE FRAZIER 10FR DISP (MISCELLANEOUS) IMPLANT
SUT MNCRL AB 4-0 PS2 18 (SUTURE) ×3 IMPLANT
SUT VIC AB 0 CT1 27 (SUTURE) ×2
SUT VIC AB 0 CT1 27XBRD ANBCTR (SUTURE) ×1 IMPLANT
SUT VIC AB 1 CT1 27 (SUTURE)
SUT VIC AB 1 CT1 27XBRD ANBCTR (SUTURE) IMPLANT
SUT VIC AB 2-0 CT1 (SUTURE) ×2 IMPLANT
SUT VIC AB 2-0 SH 27 (SUTURE) ×2
SUT VIC AB 2-0 SH 27XBRD (SUTURE) ×1 IMPLANT
SUT VIC AB 3-0 SH 27 (SUTURE) ×2
SUT VIC AB 3-0 SH 27X BRD (SUTURE) IMPLANT
SYR BULB 3OZ (MISCELLANEOUS) ×3 IMPLANT
TOWEL GREEN STERILE FF (TOWEL DISPOSABLE) ×3 IMPLANT
TUBE SUCTION HIGH CAP CLEAR NV (SUCTIONS) ×3 IMPLANT

## 2019-03-21 NOTE — H&P (Signed)
PREOPERATIVE H&P  Chief Complaint: LEFT RADIAL HEAD FRACTURE  HPI: Patrick Jensen is a 48 y.o. male who presents for preoperative history and physical with a diagnosis of LEFT RADIAL HEAD FRACTURE. Symptoms are rated as moderate to severe, and have been worsening.  This is significantly impairing activities of daily living.  Please see my clinic note for full details on this patient's care.  He has elected for surgical management.   History reviewed. No pertinent past medical history. Past Surgical History:  Procedure Laterality Date  . ORIF WRIST FRACTURE     Social History   Socioeconomic History  . Marital status: Single    Spouse name: Not on file  . Number of children: Not on file  . Years of education: Not on file  . Highest education level: Not on file  Occupational History  . Not on file  Social Needs  . Financial resource strain: Not on file  . Food insecurity    Worry: Not on file    Inability: Not on file  . Transportation needs    Medical: Not on file    Non-medical: Not on file  Tobacco Use  . Smoking status: Current Every Day Smoker    Packs/day: 2.00    Types: Cigarettes  . Smokeless tobacco: Never Used  Substance and Sexual Activity  . Alcohol use: No  . Drug use: Yes    Types: Marijuana  . Sexual activity: Not Currently  Lifestyle  . Physical activity    Days per week: Not on file    Minutes per session: Not on file  . Stress: Not on file  Relationships  . Social Musicianconnections    Talks on phone: Not on file    Gets together: Not on file    Attends religious service: Not on file    Active member of club or organization: Not on file    Attends meetings of clubs or organizations: Not on file    Relationship status: Not on file  Other Topics Concern  . Not on file  Social History Narrative  . Not on file   History reviewed. No pertinent family history. No Known Allergies Prior to Admission medications   Medication Sig Start Date End Date Taking?  Authorizing Provider  ibuprofen (ADVIL) 600 MG tablet Take 1 tablet (600 mg total) by mouth every 6 (six) hours as needed. 03/15/19  Yes Law, Waylan BogaAlexandra M, PA-C  oxyCODONE-acetaminophen (PERCOCET/ROXICET) 5-325 MG tablet Take 1 tablet by mouth every 6 (six) hours as needed for severe pain. 03/15/19  Yes Law, Waylan BogaAlexandra M, PA-C     Positive ROS: All other systems have been reviewed and were otherwise negative with the exception of those mentioned in the HPI and as above.  Physical Exam: General: Alert, no acute distress Cardiovascular: No pedal edema Respiratory: No cyanosis, no use of accessory musculature GI: No organomegaly, abdomen is soft and non-tender Skin: No lesions in the area of chief complaint Neurologic: Sensation intact distally Psychiatric: Patient is competent for consent with normal mood and affect Lymphatic: No axillary or cervical lymphadenopathy  MUSCULOSKELETAL: L arm pain, wwp hand, NVID  Assessment: LEFT RADIAL HEAD FRACTURE  Plan: Plan for Procedure(s): LEFT RADIAL HEAD ARTHROPLASTY  The risks benefits and alternatives were discussed with the patient including but not limited to the risks of nonoperative treatment, versus surgical intervention including infection, bleeding, nerve injury,  blood clots, cardiopulmonary complications, morbidity, mortality, among others, and they were willing to proceed.   Kee Drudge T  Griffin Basil, MD  03/21/2019 11:44 AM

## 2019-03-21 NOTE — Discharge Instructions (Signed)

## 2019-03-21 NOTE — Anesthesia Procedure Notes (Signed)
Procedure Name: LMA Insertion Performed by: Marvel Mcphillips W, CRNA Pre-anesthesia Checklist: Patient identified, Emergency Drugs available, Suction available and Patient being monitored Patient Re-evaluated:Patient Re-evaluated prior to induction Oxygen Delivery Method: Circle system utilized Preoxygenation: Pre-oxygenation with 100% oxygen Induction Type: IV induction Ventilation: Mask ventilation without difficulty LMA: LMA inserted LMA Size: 5.0 Number of attempts: 1 Placement Confirmation: positive ETCO2 Tube secured with: Tape Dental Injury: Teeth and Oropharynx as per pre-operative assessment        

## 2019-03-21 NOTE — Anesthesia Preprocedure Evaluation (Signed)
Anesthesia Evaluation  Patient identified by MRN, date of birth, ID band Patient awake    Reviewed: Allergy & Precautions, NPO status , Patient's Chart, lab work & pertinent test results  Airway Mallampati: II  TM Distance: >3 FB Neck ROM: Full    Dental no notable dental hx.    Pulmonary Current Smoker,    Pulmonary exam normal breath sounds clear to auscultation       Cardiovascular negative cardio ROS Normal cardiovascular exam Rhythm:Regular Rate:Normal     Neuro/Psych negative neurological ROS  negative psych ROS   GI/Hepatic negative GI ROS, Neg liver ROS,   Endo/Other  negative endocrine ROS  Renal/GU negative Renal ROS  negative genitourinary   Musculoskeletal negative musculoskeletal ROS (+)   Abdominal   Peds negative pediatric ROS (+)  Hematology negative hematology ROS (+)   Anesthesia Other Findings   Reproductive/Obstetrics negative OB ROS                             Anesthesia Physical Anesthesia Plan  ASA: II  Anesthesia Plan: General   Post-op Pain Management:  Regional for Post-op pain   Induction: Intravenous  PONV Risk Score and Plan: 1 and Ondansetron  Airway Management Planned: LMA  Additional Equipment:   Intra-op Plan:   Post-operative Plan:   Informed Consent: I have reviewed the patients History and Physical, chart, labs and discussed the procedure including the risks, benefits and alternatives for the proposed anesthesia with the patient or authorized representative who has indicated his/her understanding and acceptance.     Dental advisory given  Plan Discussed with: CRNA  Anesthesia Plan Comments:         Anesthesia Quick Evaluation

## 2019-03-21 NOTE — Progress Notes (Signed)
Assisted Dr. Carignan with left, ultrasound guided, supraclavicular block. Side rails up, monitors on throughout procedure. See vital signs in flow sheet. Tolerated Procedure well. 

## 2019-03-21 NOTE — Anesthesia Procedure Notes (Signed)
Anesthesia Regional Block: Supraclavicular block   Pre-Anesthetic Checklist: ,, timeout performed, Correct Patient, Correct Site, Correct Laterality, Correct Procedure, Correct Position, site marked, Risks and benefits discussed,  Surgical consent,  Pre-op evaluation,  At surgeon's request and post-op pain management  Laterality: Left and Upper  Prep: Maximum Sterile Barrier Precautions used, chloraprep       Needles:  Injection technique: Single-shot  Needle Type: Echogenic Stimulator Needle     Needle Length: 10cm      Additional Needles:   Procedures:,,,, ultrasound used (permanent image in chart),,,,  Narrative:  Start time: 03/21/2019 11:34 AM End time: 03/21/2019 11:42 AM Injection made incrementally with aspirations every 5 mL.  Performed by: Personally  Anesthesiologist: Montez Hageman, MD  Additional Notes: Risks, benefits and alternative to block explained extensively.  Patient tolerated procedure well, without complications.

## 2019-03-24 NOTE — Op Note (Signed)
Orthopaedic Surgery Operative Note (CSN: [id number])  TJ KITCHINGS  01/30/1971 Date of Surgery: 03/21/2019   Diagnoses:  LEFT RADIAL HEAD FRACTURE and lateral ligament injury  Procedure: Left radial head arthroplasty and lateral ligament repair with local tissue   Operative Finding Successful completion of the planned procedure.  Patient's ligaments were partially avulsed on the lateral side.  We actually had to take down additional ligament to complete the case and get good visualization of the fracture fragments.  There are multiple fragments and is clearly not amenable to repair as there was a neck fracture as well as a head split fracture.  Good repair of the lateral ligaments and capsule as well as a separate annular ligament repair was all completed.  Patient had a relatively large radial head measuring 28 mm but we downsized to a 26.   Post-operative plan: The patient will be nonweightbearing in a splint for a week and then transition to a Bledsoe brace unlocked.  The patient will be discharged home.  DVT prophylaxis not indicated in amatory upper extremity patient without risk factors.   Pain control with PRN pain medication preferring oral medicines.  Follow up plan will be scheduled in approximately 7 days for incision check and XR.  Post-Op Diagnosis: Same Surgeons:Primary: Hiram Gash, MD Assistants: None Location: [facility] OR ROOM 6 Anesthesia: General with regional Antibiotics: Ancef 2g preop and local 1 g vancomycin powder Tourniquet time:  Total Tourniquet Time Documented: Upper Arm (Left) - 49 minutes Total: Upper Arm (Left) - 49 minutes  Estimated Blood Loss: Minimal Complications: None Specimens: None Implants: Implant Name Type Inv. Item Serial No. Manufacturer Lot No. LRB No. Used Action  zimmer radial head component    [employer] [id number] Left 1 Implanted  IMPLANT STEM 8X2 - [id number] Orthopedic Implant IMPLANT STEM 8X2  [employer](ORTH,TRAU,BIO,SG) [id number]  Left 1 Implanted  KIT IMPLANT JUGGERKNOT SZ2 - [id number] Orthopedic Implant KIT IMPLANT JUGGERKNOT SZ2  [employer]ON(ORTH,TRAU,BIO,SG) [id number] Left 1 Implanted    Indications for Surgery:   BRYNDAN BILYK is a 48 y.o. male with left elbow injury after a fall while at work off of the ladder.  We discussed the condition at length and he had a comminuted radial head and neck fracture which would be better served with a radial head arthroplasty I felt that a ORIF attempt due to the high risk of AVN and fracture failure of fixation.  Benefits and risks of operative and nonoperative management were discussed prior to surgery with patient/guardian(s) and informed consent form was completed.  Specific risks including infection, need for additional surgery, stiffness, heterotopic ossification, continued pain   Procedure:   The patient was identified properly. Informed consent was obtained and the surgical site was marked. The patient was taken up to suite where general anesthesia was induced.  The patient was positioned supine on a hand table.  The left elbow was prepped and draped in the usual sterile fashion.  Timeout was performed before the beginning of the case.  Tourniquet was used for the above duration.  We began with a longitudinal approach to the lateral elbow through Coker's interval.  A 6 cm incision was made through the skin we achieved hemostasis to be progressed.  We identified the fascia and was split and Coker's interval just anterior to the lateral ulnar collateral ligament path.  We were able to identify the joint immediately and saw fracture hematoma which was evacuated.  The lateral ligaments were obviously partially avulsed  from the lateral epicondyle leaving a bare area.  We released these further to aid in visualization as we were going to clearly have to repair them as they were.  This point multiple radial head fragments were removed and we noted that the patient is actually  sustained a radial neck fracture as well.  All the fragments were sized on the back table.  Patient's radial head measured 28 mm but when trialing we felt that a 26 provided better overall alignment stability and appropriate size and fluoroscopy.  We freshened our radial neck cut and used a planer to make it flat.  This was broached up to a size 8 stem. At that point the [employer] L2L system was used to trial the initial implants and then we were able to broach in place our final implants without issue.  This point we performed a lateral ligament repair.  We were able to place a juggernaut suture anchor in the center rotation of the lateral epicondyle and use a locking Krakw type stitch with 1 limb through the lateral ligaments and use the other limb as opposed to repair the ligaments.  Were happy with her final repair.  The split in the fascia was closed longitudinally with Vicryl.  We irrigated the wound copiously before placing local antibiotic as listed above.  Close the incision in a multilayer fashion with absorbable suture.  Sterile dressing was placed.  Well-padded long-arm splint was placed.  Patient was awoken taken to PACU in stable condition.

## 2019-03-25 NOTE — Anesthesia Postprocedure Evaluation (Signed)
Anesthesia Post Note  Patient: Patrick Jensen  Procedure(s) Performed: LEFT RADIAL HEAD ARTHROPLASTY (Left Elbow)     Patient location during evaluation: PACU Anesthesia Type: General Level of consciousness: awake and alert Pain management: pain level controlled Vital Signs Assessment: post-procedure vital signs reviewed and stable Respiratory status: spontaneous breathing, nonlabored ventilation, respiratory function stable and patient connected to nasal cannula oxygen Cardiovascular status: blood pressure returned to baseline and stable Postop Assessment: no apparent nausea or vomiting Anesthetic complications: no    Last Vitals:  Vitals:   03/21/19 1345 03/21/19 1422  BP: 118/79 137/88  Pulse: 70 77  Resp: 12 18  Temp:  37 C  SpO2: 100% 94%    Last Pain:  Vitals:   03/21/19 1422  PainSc: 0-No pain                 Montez Hageman

## 2019-03-26 ENCOUNTER — Encounter (HOSPITAL_BASED_OUTPATIENT_CLINIC_OR_DEPARTMENT_OTHER): Payer: Self-pay | Admitting: Orthopaedic Surgery

## 2019-03-28 ENCOUNTER — Encounter (HOSPITAL_BASED_OUTPATIENT_CLINIC_OR_DEPARTMENT_OTHER): Payer: Self-pay | Admitting: Orthopaedic Surgery

## 2019-03-28 NOTE — Transfer of Care (Signed)
Immediate Anesthesia Transfer of Care Note  Patient: [name]  Procedure(s) Performed: LEFT RADIAL HEAD ARTHROPLASTY (Left Elbow)  Patient Location: PACU  Anesthesia Type:General  Level of Consciousness: awake and sedated  Airway & Oxygen Therapy: Patient Spontanous Breathing and Patient connected to nasal cannula oxygen  Post-op Assessment: Report given to RN and Post -op Vital signs reviewed and stable  Post vital signs: Reviewed and stable  Last Vitals:  Vitals Value Taken Time  BP 137/88 03/21/19 1422  Temp 37 C 03/21/19 1422  Pulse 77 03/21/19 1422  Resp 18 03/21/19 1422  SpO2 94 % 03/21/19 1422    Last Pain:  Vitals:   03/25/19 1559  PainSc: 5       Patients Stated Pain Goal: 5 (09/32/67 1245)  Complications: No apparent anesthesia complications

## 2019-05-21 IMAGING — CR DG FOOT COMPLETE 3+V*L*
3 series · 3 of 3 positions shown · non-contrast
Comparison: None.

CLINICAL DATA: Left foot pain after injury 2 days ago.

EXAM:
LEFT FOOT - COMPLETE 3+ VIEW

[x foot ap left]
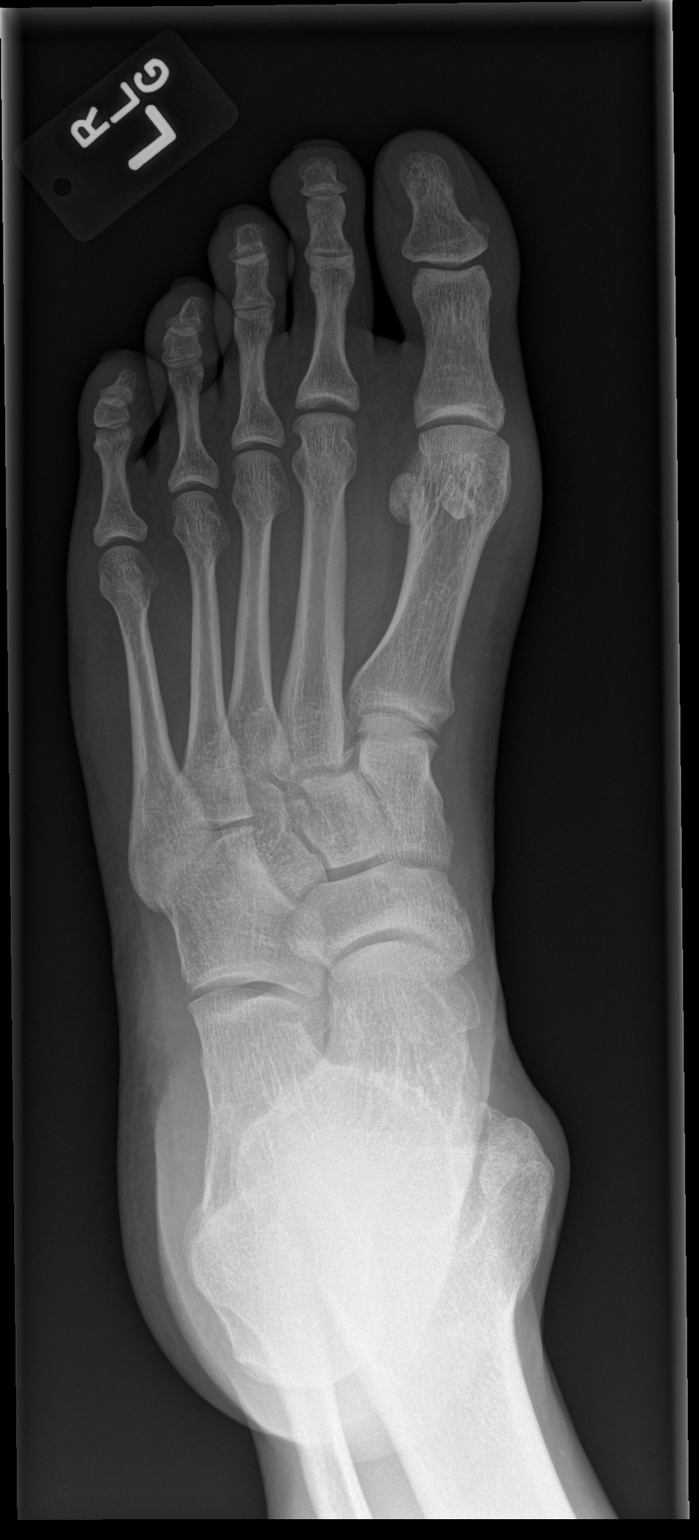

[x foot obl left]
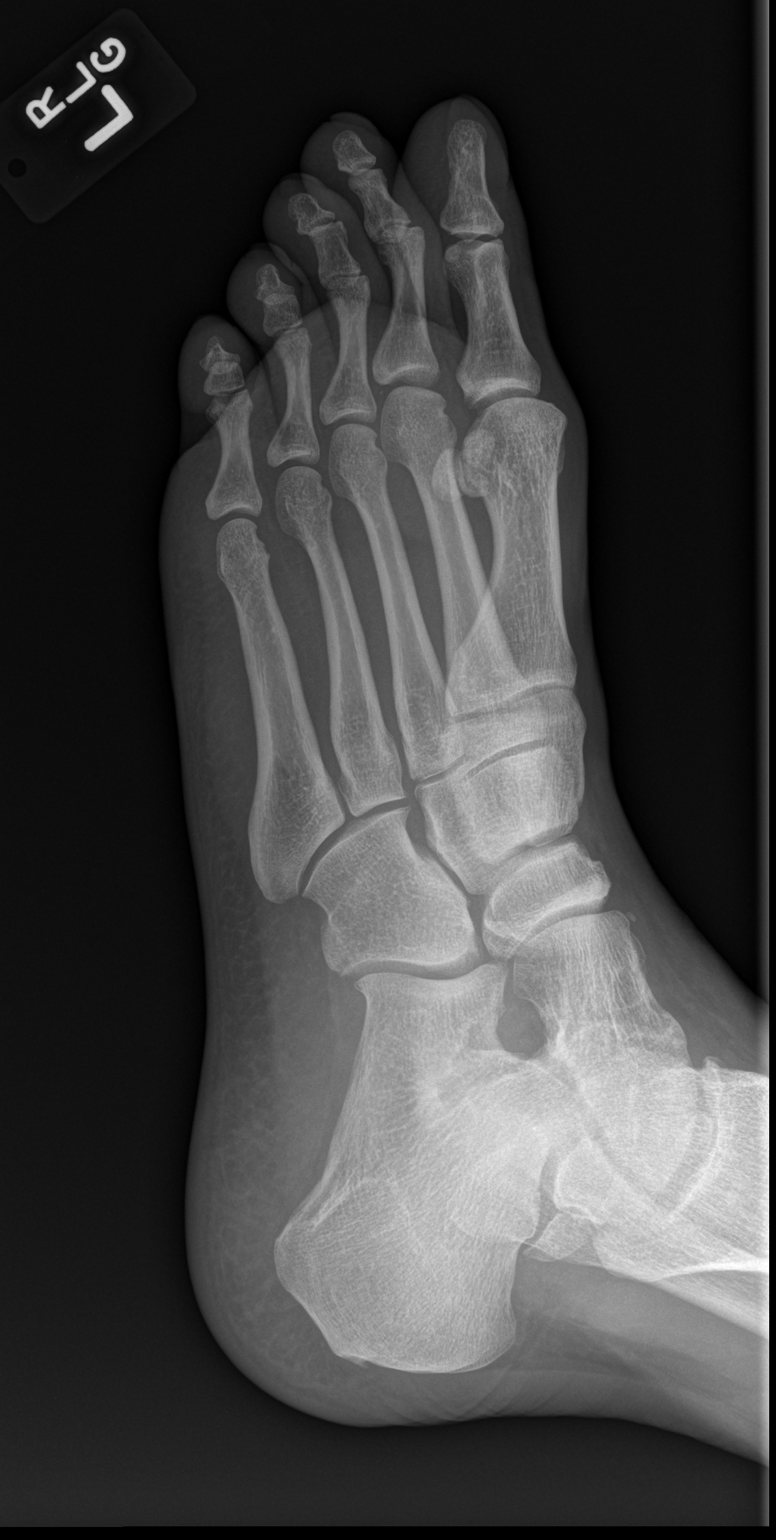

[x foot lat left]
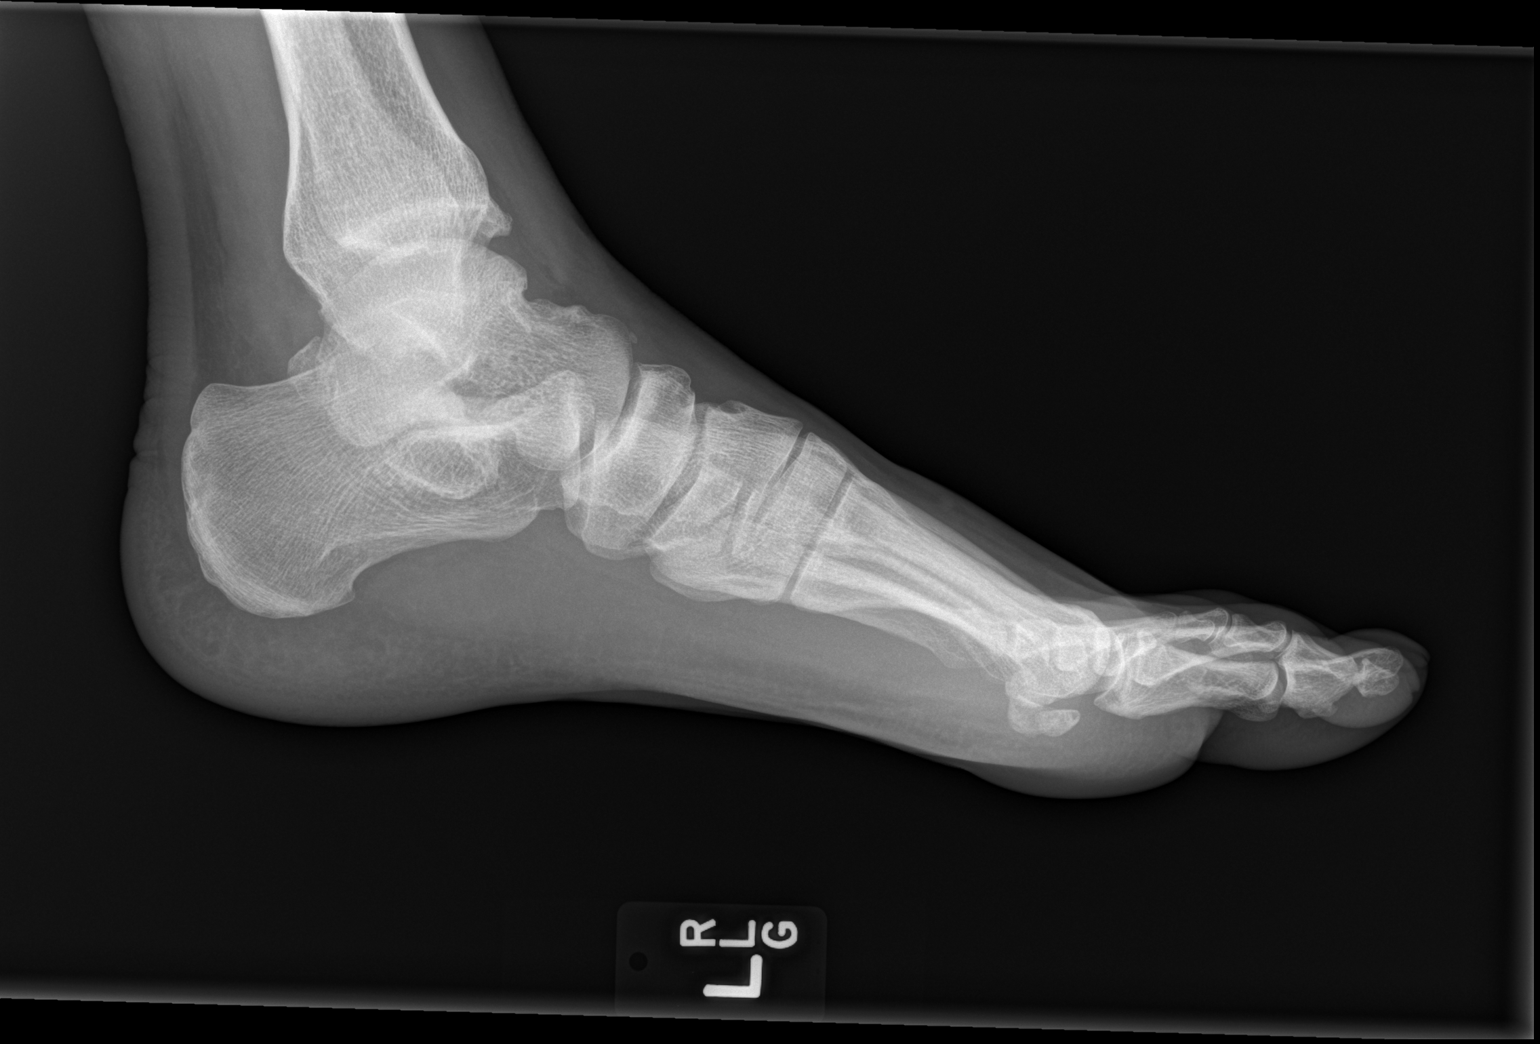

[3 of 3 positions shown; findings below may reference images not displayed]

FINDINGS: There is no evidence of fracture or dislocation. There is no
evidence of arthropathy or other focal bone abnormality. Soft
tissues are unremarkable.
IMPRESSION: Normal left foot.

## 2021-03-22 ENCOUNTER — Emergency Department (HOSPITAL_BASED_OUTPATIENT_CLINIC_OR_DEPARTMENT_OTHER)
Admission: EM | Admit: 2021-03-22 | Discharge: 2021-03-22 | Disposition: A | Discharge status: Home / Self Care | Payer: Self-pay | Attending: Emergency Medicine | Admitting: Emergency Medicine

## 2021-03-22 ENCOUNTER — Other Ambulatory Visit: Payer: Self-pay

## 2021-03-22 DIAGNOSIS — L237 Allergic contact dermatitis due to plants, except food: Secondary | ICD-10-CM

## 2021-03-22 DIAGNOSIS — L259 Unspecified contact dermatitis, unspecified cause: Secondary | ICD-10-CM | POA: Insufficient documentation

## 2021-03-22 DIAGNOSIS — F17210 Nicotine dependence, cigarettes, uncomplicated: Secondary | ICD-10-CM | POA: Insufficient documentation

## 2021-03-22 MED ORDER — PREDNISONE 10 MG PO TABS: ORAL_TABLET | ORAL | 0 refills | Status: AC

## 2021-03-22 MED ORDER — HYDROXYZINE HCL 25 MG PO TABS: 25.0000 mg | ORAL_TABLET | Freq: Four times a day (QID) | ORAL | 0 refills | Status: AC | PRN

## 2021-03-22 NOTE — ED Triage Notes (Signed)
Pt reports "poison oak" rash since cutting brush Thursday. Weeping blisters noted to both arms. Pt states he has blisters on upper legs also.

## 2021-03-22 NOTE — ED Provider Notes (Signed)
[facility] EMERGENCY DEPARTMENT Provider Note   CSN: [id number] Arrival date & time: 03/22/21  0801     History Chief Complaint  Patient presents with   Rash    Patrick Jensen is a 50 y.o. male.  HPI     50yo male presents with concern for rash.  Reports pruritic rash located bilateral forearms, bilateral legs and abdomen that began on Friday.  Had exposure to poison oak while working outside Thursday and noticed rash on Friday. No fevers, chills.  Denies other new medications or other exposures.  Has tried hydrocortisone, calamine, benadryl, without significant relief.   No past medical history on file.  There are no problems to display for this patient.   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  Social History   Tobacco Use   Smoking status: Every Day    Packs/day: 2.00    Pack years: 0.00    Types: Cigarettes   Smokeless tobacco: Never  Vaping Use   Vaping Use: Never used  Substance Use Topics   Alcohol use: No   Drug use: Yes    Types: Marijuana    Home Medications Prior to Admission medications   Not on File    Allergies    Patient has no known allergies.  Review of Systems   Review of Systems  Constitutional:  Negative for fever.  Respiratory:  Negative for cough.   Gastrointestinal:  Negative for abdominal pain.  Skin:  Positive for rash.  Neurological:  Negative for syncope.   Physical Exam Updated Vital Signs There were no vitals taken for this visit.  Physical Exam Vitals and nursing note reviewed.  Constitutional:      General: He is not in acute distress.    Appearance: Normal appearance. He is not ill-appearing, toxic-appearing or diaphoretic.  HENT:      Head: Normocephalic.  Eyes:     Conjunctiva/sclera: Conjunctivae normal.  Cardiovascular:     Rate and Rhythm: Normal rate and regular rhythm.     Pulses: Normal pulses.  Pulmonary:     Effort: Pulmonary effort is normal. No respiratory distress.  Musculoskeletal:        General: No deformity or signs of injury.     Cervical back: No rigidity.  Skin:    General: Skin is warm and dry.     Coloration: Skin is not jaundiced or pale.     Findings: Rash (grouped vesicles over bilateral forearms, legs with erythema, edema) present.  Neurological:     General: No focal deficit present.     Mental Status: He is alert and oriented to person, place, and time.    ED Results / Procedures / Treatments   Labs (all labs ordered are listed, but only abnormal results are displayed) Labs Reviewed - No data to display  EKG None  Radiology No results found.  Procedures Procedures   Medications Ordered in ED Medications - No data to display  ED Course  I have reviewed the triage vital signs and the nursing notes.  Pertinent labs & imaging results that were available during my care of the patient were reviewed by me and considered in my medical decision making (see chart  for details).    MDM Rules/Calculators/A&P                          50 year old male who presents with concern for pruritic rash that developed after exposure to poison oak.  Rash has appearance of poison oak contact dermatitis, and history is consistent with this.  Do not suspect other etiology of rash by history or exam.  No sign of cellulitis or infection.  Given diffuse nature, feel he would benefit from oral steroids.  Given prescription for 15-day taper of prednisone, and hydroxyzine for pruritus. Patient discharged in stable condition with understanding of reasons to return.     Final Clinical Impression(s) / ED Diagnoses Final diagnoses:  None    Rx / DC Orders ED Discharge Orders     None         Alvira Monday, MD 03/22/21 [phone]

## 2022-01-12 ENCOUNTER — Emergency Department (HOSPITAL_COMMUNITY): Payer: Self-pay

## 2022-01-12 ENCOUNTER — Encounter (HOSPITAL_COMMUNITY): Payer: Self-pay

## 2022-01-12 ENCOUNTER — Other Ambulatory Visit: Payer: Self-pay

## 2022-01-12 ENCOUNTER — Emergency Department (HOSPITAL_COMMUNITY)
Admission: EM | Admit: 2022-01-12 | Discharge: 2022-01-12 | Discharge status: Against Medical Advice | Payer: Self-pay | Attending: Emergency Medicine | Admitting: Emergency Medicine

## 2022-01-12 DIAGNOSIS — W12XXXA Fall on and from scaffolding, initial encounter: Secondary | ICD-10-CM | POA: Insufficient documentation

## 2022-01-12 DIAGNOSIS — R42 Dizziness and giddiness: Secondary | ICD-10-CM | POA: Insufficient documentation

## 2022-01-12 DIAGNOSIS — Z5321 Procedure and treatment not carried out due to patient leaving prior to being seen by health care provider: Secondary | ICD-10-CM | POA: Insufficient documentation

## 2022-01-12 DIAGNOSIS — R1110 Vomiting, unspecified: Secondary | ICD-10-CM | POA: Insufficient documentation

## 2022-01-12 DIAGNOSIS — S0181XA Laceration without foreign body of other part of head, initial encounter: Secondary | ICD-10-CM | POA: Insufficient documentation

## 2022-01-12 NOTE — ED Triage Notes (Addendum)
Reports fell off scaffold and hit head on concrete, lac to right forehead. Denies LOC.  Fall was approx 5 feet.  Denies neck pain.  ?

## 2022-01-12 NOTE — ED Provider Triage Note (Signed)
Emergency Medicine Provider Triage Evaluation Note ? ?Patrick Jensen , a 51 y.o. male  was evaluated in triage.  Pt complains of fall.  Just prior to arrival in the emergency department patient had suffered a fall off of scaffolding.  Scaffolding was approxi-5 feet off the ground.  Patient Dors is hitting his head on concrete.  Denies any loss of consciousness.  Patient reports 1 episode of vomiting and feeling lightheaded after his head injury.  Nausea and lightheadedness have resolved.  Patient has been ambulatory since the fall.  Patient is not on any blood thinners.  Patient believes his last tetanus shot was maybe 2 or 3 years prior when he broke his elbow. ? ?Denies any numbness, weakness, facial asymmetry, dysarthria, diplopia, blurred vision, saddle anesthesia, bowel/bladder incontinence, chest pain, shortness of breath, abdominal pain. ? ?Review of Systems  ?Positive: Laceration ?Negative: See above ? ?Physical Exam  ?BP 115/86 (BP Location: Left Arm)   Pulse 64   Temp (!) 97.4 ?F (36.3 ?C) (Oral)   Resp 16   Ht 5\' 10"  (1.778 m)   Wt 88.5 kg   SpO2 100%   BMI 27.98 kg/m?  ?Gen:   Awake, no distress   ?Resp:  Normal effort  ?MSK:   Moves extremities without difficulty  ?Other:  2 cm laceration above right eyebrow, hemorrhage controlled. ? ?No midline tenderness or deformity to cervical, thoracic, or lumbar spine. ? ?No hemotympanum, battle sign, raccoon sign. ? ?Abdomen soft, nondistended, nontender with no guarding or rebound tenderness.  Superficial abrasion to bilateral upper abdomen. ? ?CN II through XII intact.  +5 strength to bilateral upper and lower extremities.  Sensation grossly intact to bilateral upper and lower extremities. ? ?Medical Decision Making  ?Medically screening exam initiated at 1:46 PM.  Appropriate orders placed.  Patrick Jensen was informed that the remainder of the evaluation will be completed by another provider, this initial triage assessment does not replace that evaluation,  and the importance of remaining in the ED until their evaluation is complete. ? ?Due to mechanism of injury will obtain CT imaging of head and cervical spine. ?  ?Patrick Danker, PA-C ?01/12/22 1349 ? ?

## 2022-01-12 NOTE — ED Notes (Signed)
Called pts name 3x to be roomed with no response.  

## 2024-03-05 IMAGING — CT CT HEAD W/O CM
4 series · 15 of 47 positions shown, 17 images · non-contrast
Comparison: None.

CLINICAL DATA: Head trauma



[Series 3: head without · axial · non-contrast · 0.43mm/px · z∈[-59,+61]mm · 7 of 34 slices shown, 9 images]
[im 5/34  brain]
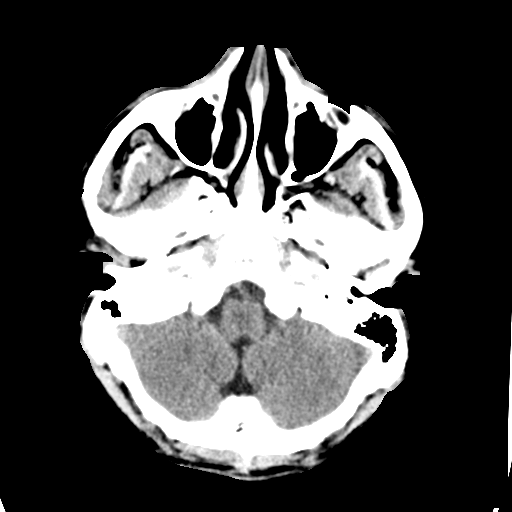
[im 5/34  bone]
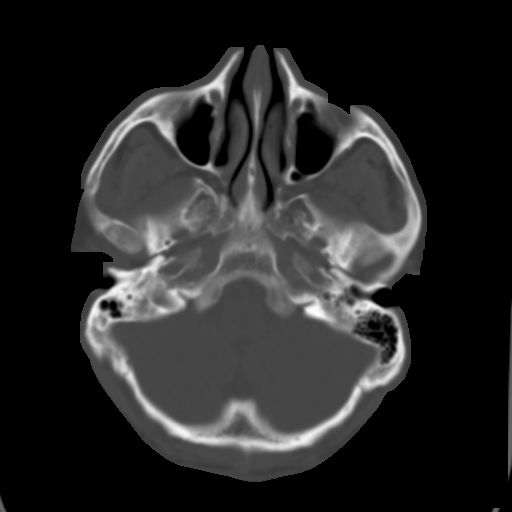
[im 9/34  brain]
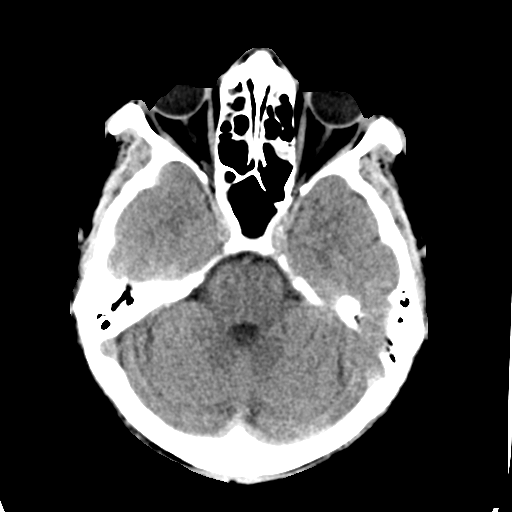
[im 13/34  brain]
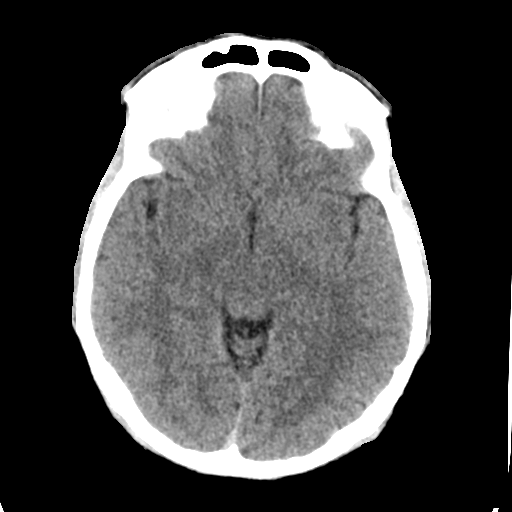
[im 17/34  brain]
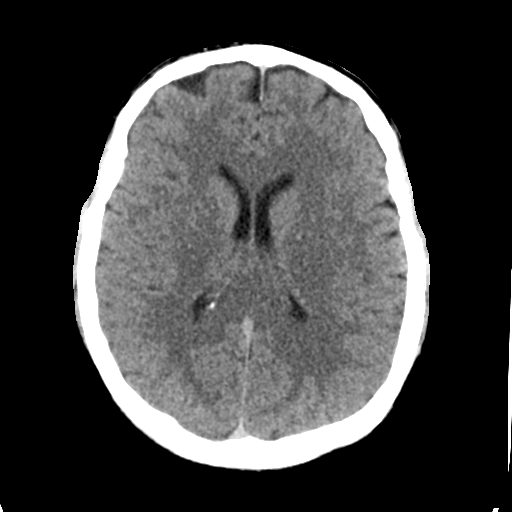
[im 21/34  brain]
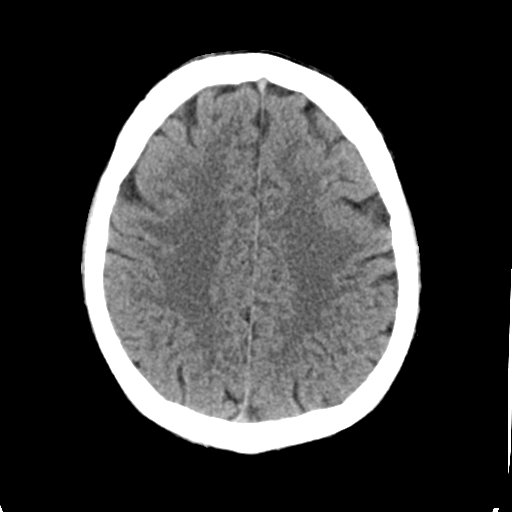
[im 21/34  bone]
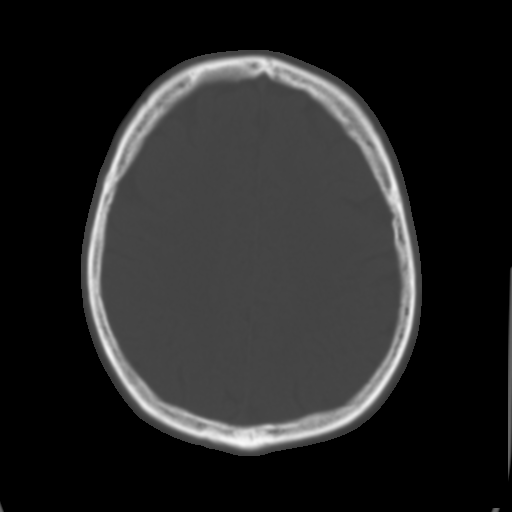
[im 25/34  brain]
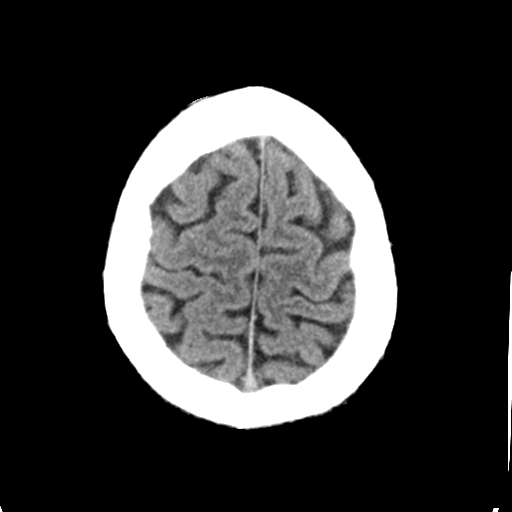
[im 29/34  brain]
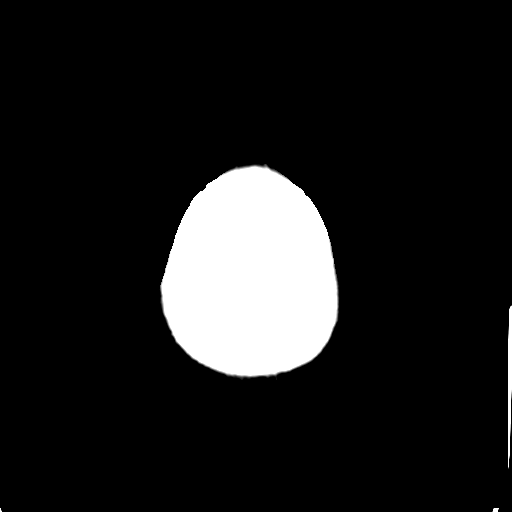

[Series 4: head bone · axial · 0.43mm/px · z∈[-63,-47]mm · 2 of 85 slices shown]
[im 9/85  bone]
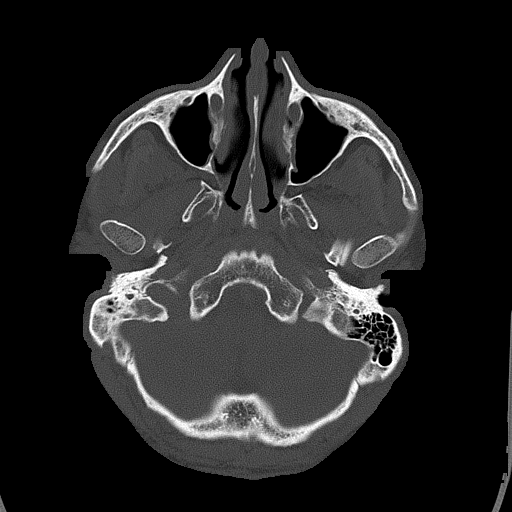
[im 17/85  bone]
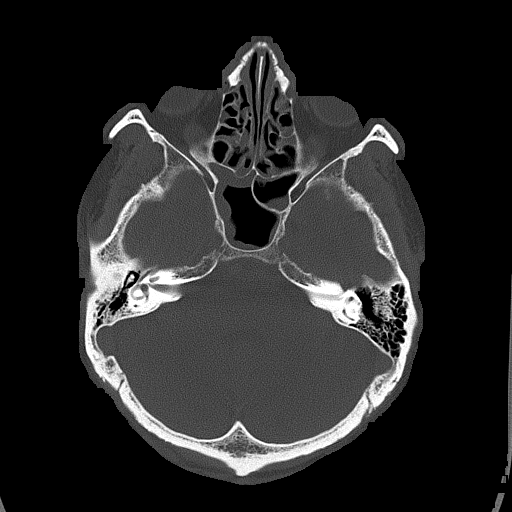

[Series 5: head without cor · coronal · non-contrast · 0.33mm/px · 3 of 67 slices shown]
[im 23/67  brain]
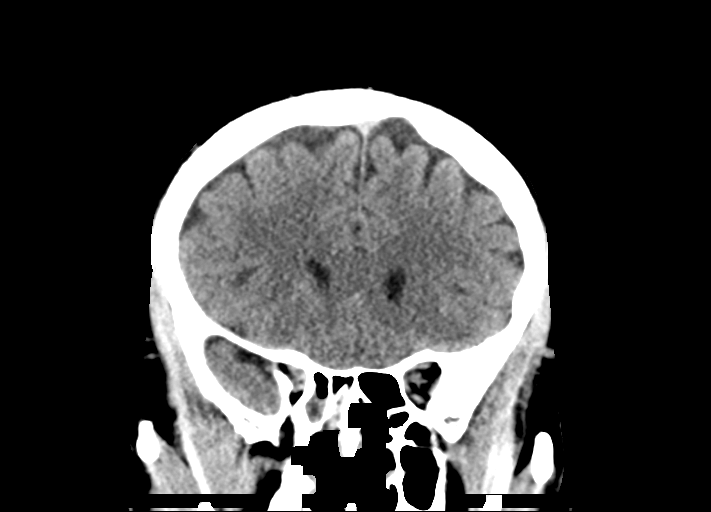
[im 30/67  brain]
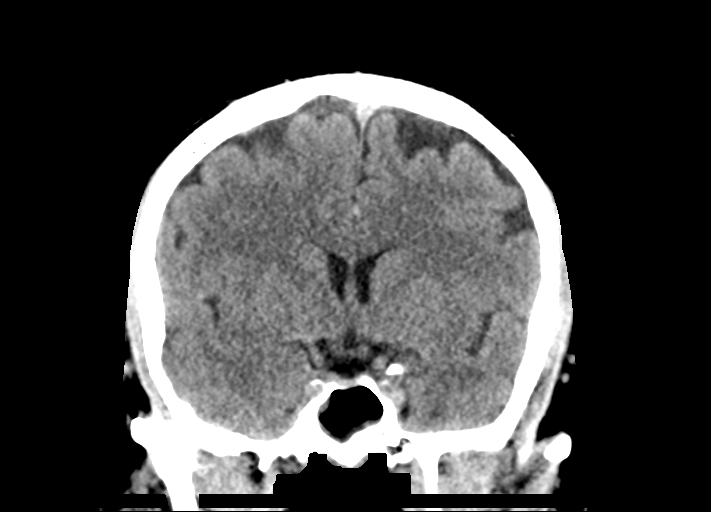
[im 37/67  brain]
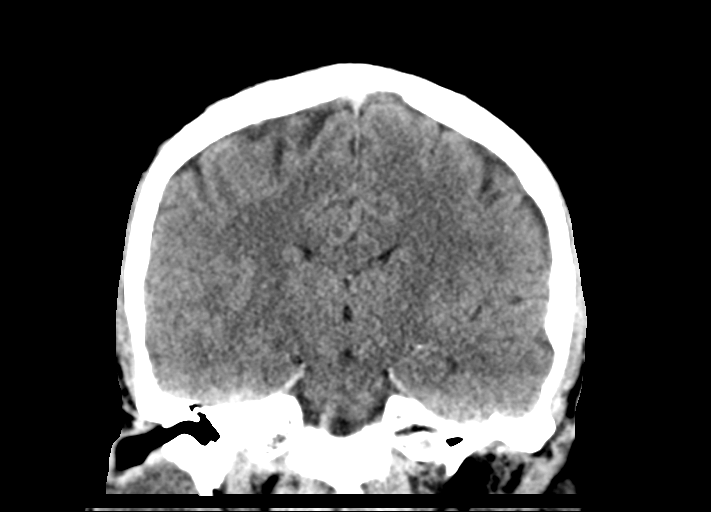

[Series 6: head without sag · sagittal · non-contrast · 0.33mm/px · 3 of 64 slices shown]
[im 22/64  brain]
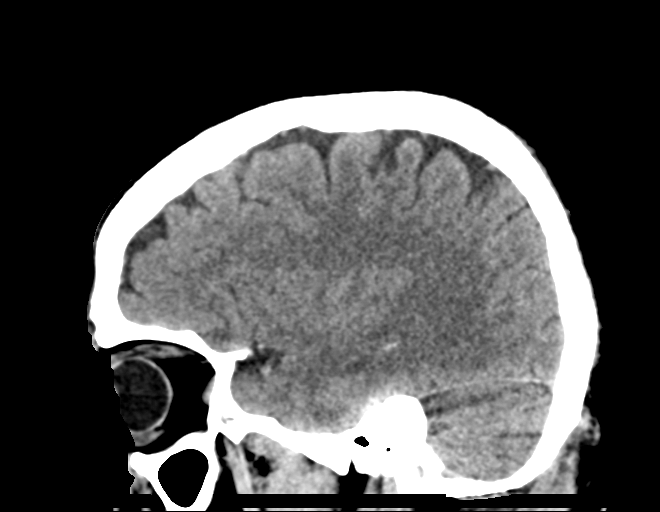
[im 32/64  brain]
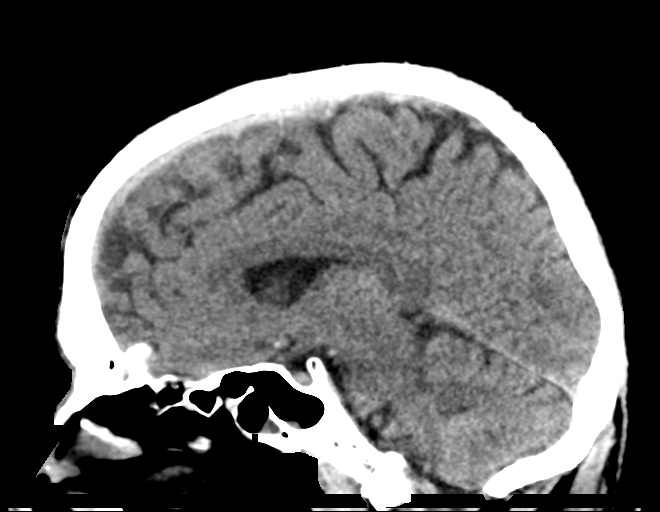
[im 43/64  brain]
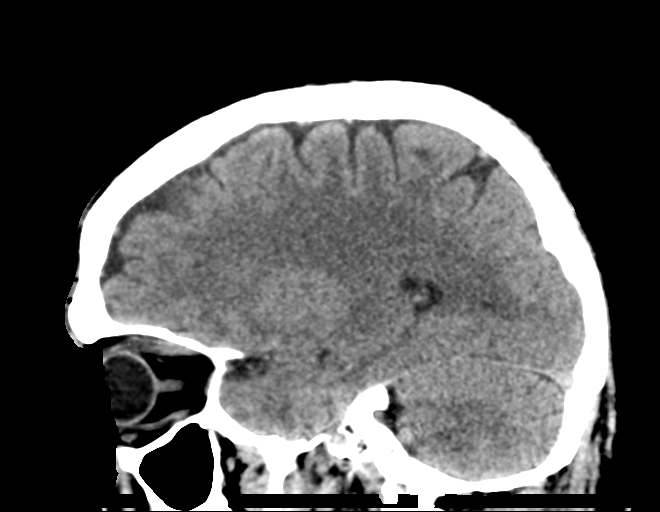

[15 of 47 positions shown; findings below may reference images not displayed]

FINDINGS: Brain: No acute intracranial hemorrhage, mass effect, or herniation.
No extra-axial fluid collections. No evidence of acute territorial
infarct. No hydrocephalus.

Vascular: No hyperdense vessel or unexpected calcification.

Skull: Normal. Negative for fracture or focal lesion.

Sinuses/Orbits: Moderate mucosal thickening and opacification in the
sphenoid and ethmoid sinuses.

Other: Soft tissue swelling and laceration at the right forehead.
IMPRESSION: 1. No acute intracranial process identified.
2. Soft tissue swelling and laceration at the right forehead.
